# Patient Record
Sex: Female | Born: 1965 | Race: White | Hispanic: No | Smoking: Current some day smoker
Health system: Southern US, Community
[De-identification: ages and names within clinical notes are randomized; demographics above are authoritative.]

## PROBLEM LIST (undated history)

## (undated) DIAGNOSIS — I10 Essential (primary) hypertension: Secondary | ICD-10-CM

## (undated) DIAGNOSIS — E7800 Pure hypercholesterolemia, unspecified: Secondary | ICD-10-CM

## (undated) DIAGNOSIS — E079 Disorder of thyroid, unspecified: Secondary | ICD-10-CM

## (undated) DIAGNOSIS — G8929 Other chronic pain: Secondary | ICD-10-CM

## (undated) DIAGNOSIS — F32A Depression, unspecified: Secondary | ICD-10-CM

## (undated) DIAGNOSIS — J45909 Unspecified asthma, uncomplicated: Secondary | ICD-10-CM

## (undated) DIAGNOSIS — G4730 Sleep apnea, unspecified: Secondary | ICD-10-CM

## (undated) DIAGNOSIS — M797 Fibromyalgia: Secondary | ICD-10-CM

## (undated) HISTORY — PX: SPINAL FUSION: SHX223

## (undated) HISTORY — PX: KNEE SURGERY: SHX244

## (undated) HISTORY — PX: SHOULDER SURGERY: SHX246

## (undated) HISTORY — PX: CHOLECYSTECTOMY: SHX55

## (undated) HISTORY — PX: OTHER SURGICAL HISTORY: SHX169

## (undated) HISTORY — PX: ABDOMINAL HYSTERECTOMY: SHX81

---

## 2019-12-11 ENCOUNTER — Institutional Professional Consult (permissible substitution): Payer: Medicare Other | Admitting: Neurology

## 2020-01-12 ENCOUNTER — Emergency Department (HOSPITAL_COMMUNITY): Payer: Medicare Other

## 2020-01-12 ENCOUNTER — Emergency Department (HOSPITAL_COMMUNITY)
Admission: EM | Admit: 2020-01-12 | Discharge: 2020-01-12 | Disposition: A | Discharge status: Home / Self Care | Payer: Medicare Other | Attending: Emergency Medicine | Admitting: Emergency Medicine

## 2020-01-12 ENCOUNTER — Encounter (HOSPITAL_COMMUNITY): Payer: Self-pay

## 2020-01-12 ENCOUNTER — Other Ambulatory Visit: Payer: Self-pay

## 2020-01-12 DIAGNOSIS — R0602 Shortness of breath: Secondary | ICD-10-CM | POA: Insufficient documentation

## 2020-01-12 DIAGNOSIS — J45909 Unspecified asthma, uncomplicated: Secondary | ICD-10-CM | POA: Insufficient documentation

## 2020-01-12 DIAGNOSIS — Z20822 Contact with and (suspected) exposure to covid-19: Secondary | ICD-10-CM | POA: Insufficient documentation

## 2020-01-12 DIAGNOSIS — I10 Essential (primary) hypertension: Secondary | ICD-10-CM | POA: Insufficient documentation

## 2020-01-12 DIAGNOSIS — R079 Chest pain, unspecified: Secondary | ICD-10-CM | POA: Diagnosis not present

## 2020-01-12 DIAGNOSIS — F17210 Nicotine dependence, cigarettes, uncomplicated: Secondary | ICD-10-CM | POA: Insufficient documentation

## 2020-01-12 HISTORY — DX: Disorder of thyroid, unspecified: E07.9

## 2020-01-12 HISTORY — DX: Unspecified asthma, uncomplicated: J45.909

## 2020-01-12 HISTORY — DX: Essential (primary) hypertension: I10

## 2020-01-12 HISTORY — DX: Sleep apnea, unspecified: G47.30

## 2020-01-12 HISTORY — DX: Pure hypercholesterolemia, unspecified: E78.00

## 2020-01-12 LAB — BASIC METABOLIC PANEL
Anion gap: 9 (ref 5–15)
BUN: 7 mg/dL (ref 6–20)
CO2: 26 mmol/L (ref 22–32)
Calcium: 9.1 mg/dL (ref 8.9–10.3)
Chloride: 101 mmol/L (ref 98–111)
Creatinine, Ser: 0.83 mg/dL (ref 0.44–1.00)
GFR, Estimated: >60 mL/min (ref >60)
Glucose, Bld: 110 mg/dL — ABNORMAL HIGH (ref 70–99)
Potassium: 3.9 mmol/L (ref 3.5–5.1)
Sodium: 136 mmol/L (ref 135–145)

## 2020-01-12 LAB — CBC
HCT: 39.8 % (ref 36.0–46.0)
Hemoglobin: 13 g/dL (ref 12.0–15.0)
MCH: 29.2 pg (ref 26.0–34.0)
MCHC: 32.7 g/dL (ref 30.0–36.0)
MCV: 89.4 fL (ref 80.0–100.0)
Platelets: 263 10*3/uL (ref 150–400)
RBC: 4.45 MIL/uL (ref 3.87–5.11)
RDW: 11.8 % (ref 11.5–15.5)
WBC: 7.7 10*3/uL (ref 4.0–10.5)
nRBC: 0 % (ref 0.0–0.2)

## 2020-01-12 LAB — BRAIN NATRIURETIC PEPTIDE: B Natriuretic Peptide: 49 pg/mL (ref 0.0–100.0)

## 2020-01-12 LAB — TROPONIN I (HIGH SENSITIVITY): Troponin I (High Sensitivity): 3 ng/L (ref <18)

## 2020-01-12 LAB — RESPIRATORY PANEL BY RT PCR (FLU A&B, COVID)
Influenza A by PCR: NEGATIVE
Influenza B by PCR: NEGATIVE
SARS Coronavirus 2 by RT PCR: NEGATIVE

## 2020-01-12 MED ORDER — SODIUM CHLORIDE 0.9 % IV BOLUS
1000.0000 mL | Freq: Once | INTRAVENOUS | Status: AC
  Administered 2020-01-12: 1000 mL via INTRAVENOUS

## 2020-01-12 MED ORDER — IOHEXOL 350 MG/ML SOLN
100.0000 mL | Freq: Once | INTRAVENOUS | Status: AC | PRN
  Administered 2020-01-12: 100 mL via INTRAVENOUS

## 2020-01-12 NOTE — ED Triage Notes (Signed)
Pt presents to ED with complaints of chest pressure x couple of weeks, left arm pain and tingling.

## 2020-01-12 NOTE — Discharge Instructions (Addendum)
You were evaluated in the Emergency Department and after careful evaluation, we did not find any emergent condition requiring admission or further testing in the hospital.  Your exam/testing today was overall reassuring.  We suspect that your symptoms may be related to underlying depression.  We still encourage you to keep your follow-up with your PCP and/or cardiologist but we also recommend follow-up with a therapist or psychiatrist.  Please return to the Emergency Department if you experience any worsening of your condition.  Thank you for allowing [place] to be a part of your care.

## 2020-01-12 NOTE — ED Provider Notes (Signed)
[facility]-EMERGENCY DEPT [facility] Emergency Department Provider Note MRN:  [id number]  Arrival date & time: 01/12/20     Chief Complaint   Chest Pain   History of Present Illness   Deborah Delgado is a 54 y.o. year-old female with a history of hypertension presenting to the ED with chief complaint of chest pain.  On and off chest pain or shortness of breath for the past 1 or 2 weeks, worse with deep breaths, associated with lightheadedness when standing.  Also endorsing fatigue, malaise, poor appetite.  Does note continued issues with depression but denies SI or HI, no AVH.  No drugs or alcohol, continues to smoke.  Denies fever or cough, no other complaints.  Review of Systems  A complete 10 system review of systems was obtained and all systems are negative except as noted in the HPI and PMH.   Patient's Health History    Past Medical History:  Diagnosis Date  . Asthma   . Hypercholesteremia   . Hypertension   . Sleep apnea   . Thyroid disease     Past Surgical History:  Procedure Laterality Date  . ABDOMINAL HYSTERECTOMY    . carpel tunnel    . CHOLECYSTECTOMY    . KNEE SURGERY    . SHOULDER SURGERY      No family history on file.  Social History   Socioeconomic History  . Marital status: Divorced    Spouse name: Not on file  . Number of children: Not on file  . Years of education: Not on file  . Highest education level: Not on file  Occupational History  . Not on file  Tobacco Use  . Smoking status: Current Every Day Smoker    Packs/day: 0.50    Types: Cigarettes  . Smokeless tobacco: Never Used  Substance and Sexual Activity  . Alcohol use: Yes    Comment: occ  . Drug use: Not Currently  . Sexual activity: Not on file  Other Topics Concern  . Not on file  Social History Narrative  . Not on file   Social Determinants of Health   Financial Resource Strain:   . Difficulty of Paying Living Expenses: Not on file  Food Insecurity:   . Worried About  Programme researcher, broadcasting/film/video in the Last Year: Not on file  . Ran Out of Food in the Last Year: Not on file  Transportation Needs:   . Lack of Transportation (Medical): Not on file  . Lack of Transportation (Non-Medical): Not on file  Physical Activity:   . Days of Exercise per Week: Not on file  . Minutes of Exercise per Session: Not on file  Stress:   . Feeling of Stress : Not on file  Social Connections:   . Frequency of Communication with Friends and Family: Not on file  . Frequency of Social Gatherings with Friends and Family: Not on file  . Attends Religious Services: Not on file  . Active Member of Clubs or Organizations: Not on file  . Attends Banker Meetings: Not on file  . Marital Status: Not on file  Intimate Partner Violence:   . Fear of Current or Ex-Partner: Not on file  . Emotionally Abused: Not on file  . Physically Abused: Not on file  . Sexually Abused: Not on file     Physical Exam   Vitals:   01/12/20 1130 01/12/20 1300  BP: (!) 135/105 112/73  Pulse: 75 66  Resp: (!) 21 15  Temp:    SpO2: 97% 95%    CONSTITUTIONAL: Well-appearing, NAD NEURO:  Alert and oriented x 3, no focal deficits EYES:  eyes equal and reactive ENT/NECK:  no LAD, no JVD CARDIO: Regular rate, well-perfused, normal S1 and S2 PULM:  CTAB no wheezing or rhonchi GI/GU:  normal bowel sounds, non-distended, non-tender MSK/SPINE:  No gross deformities, no edema SKIN:  no rash, atraumatic PSYCH:  Appropriate speech and behavior  *Additional and/or pertinent findings included in MDM below  Diagnostic and Interventional Summary    EKG Interpretation  Date/Time:  Monday January 12 2020 10:00:48 EST Ventricular Rate:  64 PR Interval:    QRS Duration: 84 QT Interval:  404 QTC Calculation: 417 R Axis:   17 Text Interpretation: Sinus rhythm Abnormal R-wave progression, early transition Baseline wander in lead(s) III No previous ECGs available Confirmed by Kennis Carina [id number])  on 01/12/2020 1:16:04 PM      Labs Reviewed  BASIC METABOLIC PANEL - Abnormal; Notable for the following components:      Result Value   Glucose, Bld 110 (*)    All other components within normal limits  RESPIRATORY PANEL BY RT PCR (FLU A&B, COVID)  CBC  BRAIN NATRIURETIC PEPTIDE  TROPONIN I (HIGH SENSITIVITY)    CT ANGIO CHEST PE W OR WO CONTRAST  Final Result    DG Chest Port 1 View  Final Result      Medications  sodium chloride 0.9 % bolus 1,000 mL (1,000 mLs Intravenous New Bag/Given 01/12/20 1101)  iohexol (OMNIPAQUE) 350 MG/ML injection 100 mL (100 mLs Intravenous Contrast Given 01/12/20 1152)     Procedures  /  Critical Care Procedures  ED Course and Medical Decision Making  I have reviewed the triage vital signs, the nursing notes, and pertinent available records from the EMR.  Listed above are laboratory and imaging tests that I personally ordered, reviewed, and interpreted and then considered in my medical decision making (see below for details).  Considering ACS and or PE, awaiting troponin, CTA.  Considering underlying depression contributing to patient's symptoms.     Work-up is reassuring, troponin negative, no need for second troponin given symptoms present for the past 2 weeks.  PE study is negative as well.  Patient continues to look and feel well with normal vital signs, has cardiology follow-up tomorrow.  Also advised psychiatry follow-up given her longstanding depression.  Elmer Sow. Pilar Plate, MD [facility] Emergency Medicine [facility] [email or web address]  Final Clinical Impressions(s) / ED Diagnoses     ICD-10-CM   1. Chest pain, unspecified type  R07.9   2. Chest pain  R07.9 DG Chest Port 1 View    DG Chest Port 1 View    ED Discharge Orders    None       Discharge Instructions Discussed with and Provided to Patient:     Discharge Instructions     You were evaluated in the Emergency Department and after careful  evaluation, we did not find any emergent condition requiring admission or further testing in the hospital.  Your exam/testing today was overall reassuring.  We suspect that your symptoms may be related to underlying depression.  We still encourage you to keep your follow-up with your PCP and/or cardiologist but we also recommend follow-up with a therapist or psychiatrist.  Please return to the Emergency Department if you experience any worsening of your condition.  Thank you for allowing [place] to be a part of your care.  Sabas Sous, MD 01/12/20 1335

## 2020-07-29 ENCOUNTER — Emergency Department (HOSPITAL_COMMUNITY): Payer: Medicare Other

## 2020-07-29 ENCOUNTER — Other Ambulatory Visit: Payer: Self-pay

## 2020-07-29 ENCOUNTER — Emergency Department (HOSPITAL_COMMUNITY)
Admission: EM | Admit: 2020-07-29 | Discharge: 2020-07-29 | Disposition: A | Discharge status: Home / Self Care | Payer: Medicare Other | Attending: Emergency Medicine | Admitting: Emergency Medicine

## 2020-07-29 ENCOUNTER — Encounter (HOSPITAL_COMMUNITY): Payer: Self-pay | Admitting: Emergency Medicine

## 2020-07-29 DIAGNOSIS — R079 Chest pain, unspecified: Secondary | ICD-10-CM

## 2020-07-29 DIAGNOSIS — F17210 Nicotine dependence, cigarettes, uncomplicated: Secondary | ICD-10-CM | POA: Diagnosis not present

## 2020-07-29 DIAGNOSIS — R1013 Epigastric pain: Secondary | ICD-10-CM | POA: Insufficient documentation

## 2020-07-29 DIAGNOSIS — Z20822 Contact with and (suspected) exposure to covid-19: Secondary | ICD-10-CM | POA: Diagnosis not present

## 2020-07-29 DIAGNOSIS — R197 Diarrhea, unspecified: Secondary | ICD-10-CM | POA: Diagnosis not present

## 2020-07-29 DIAGNOSIS — R0789 Other chest pain: Secondary | ICD-10-CM | POA: Diagnosis not present

## 2020-07-29 DIAGNOSIS — J45909 Unspecified asthma, uncomplicated: Secondary | ICD-10-CM | POA: Diagnosis not present

## 2020-07-29 DIAGNOSIS — I10 Essential (primary) hypertension: Secondary | ICD-10-CM | POA: Insufficient documentation

## 2020-07-29 DIAGNOSIS — R350 Frequency of micturition: Secondary | ICD-10-CM | POA: Insufficient documentation

## 2020-07-29 DIAGNOSIS — R42 Dizziness and giddiness: Secondary | ICD-10-CM | POA: Diagnosis not present

## 2020-07-29 DIAGNOSIS — R109 Unspecified abdominal pain: Secondary | ICD-10-CM | POA: Diagnosis present

## 2020-07-29 HISTORY — DX: Fibromyalgia: M79.7

## 2020-07-29 HISTORY — DX: Other chronic pain: G89.29

## 2020-07-29 HISTORY — DX: Depression, unspecified: F32.A

## 2020-07-29 LAB — COMPREHENSIVE METABOLIC PANEL
ALT: 18 U/L (ref 0–44)
AST: 21 U/L (ref 15–41)
Albumin: 4.3 g/dL (ref 3.5–5.0)
Alkaline Phosphatase: 68 U/L (ref 38–126)
Anion gap: 7 (ref 5–15)
BUN: 5 mg/dL — ABNORMAL LOW (ref 6–20)
CO2: 28 mmol/L (ref 22–32)
Calcium: 9.2 mg/dL (ref 8.9–10.3)
Chloride: 104 mmol/L (ref 98–111)
Creatinine, Ser: 0.77 mg/dL (ref 0.44–1.00)
GFR, Estimated: >60 mL/min (ref >60)
Glucose, Bld: 85 mg/dL (ref 70–99)
Potassium: 3.4 mmol/L — ABNORMAL LOW (ref 3.5–5.1)
Sodium: 139 mmol/L (ref 135–145)
Total Bilirubin: 0.5 mg/dL (ref 0.3–1.2)
Total Protein: 7.4 g/dL (ref 6.5–8.1)

## 2020-07-29 LAB — CBC WITH DIFFERENTIAL/PLATELET
Abs Immature Granulocytes: 0.02 10*3/uL (ref 0.00–0.07)
Basophils Absolute: 0 10*3/uL (ref 0.0–0.1)
Basophils Relative: 1 %
Eosinophils Absolute: 0.1 10*3/uL (ref 0.0–0.5)
Eosinophils Relative: 2 %
HCT: 40.8 % (ref 36.0–46.0)
Hemoglobin: 13.2 g/dL (ref 12.0–15.0)
Immature Granulocytes: 0 %
Lymphocytes Relative: 37 %
Lymphs Abs: 2.7 10*3/uL (ref 0.7–4.0)
MCH: 29.8 pg (ref 26.0–34.0)
MCHC: 32.4 g/dL (ref 30.0–36.0)
MCV: 92.1 fL (ref 80.0–100.0)
Monocytes Absolute: 0.4 10*3/uL (ref 0.1–1.0)
Monocytes Relative: 6 %
Neutro Abs: 4.1 10*3/uL (ref 1.7–7.7)
Neutrophils Relative %: 54 %
Platelets: 348 10*3/uL (ref 150–400)
RBC: 4.43 MIL/uL (ref 3.87–5.11)
RDW: 12 % (ref 11.5–15.5)
WBC: 7.3 10*3/uL (ref 4.0–10.5)
nRBC: 0 % (ref 0.0–0.2)

## 2020-07-29 LAB — RESP PANEL BY RT-PCR (FLU A&B, COVID) ARPGX2
Influenza A by PCR: NEGATIVE
Influenza B by PCR: NEGATIVE
SARS Coronavirus 2 by RT PCR: NEGATIVE

## 2020-07-29 LAB — URINALYSIS, ROUTINE W REFLEX MICROSCOPIC
Bacteria, UA: NONE SEEN
Bilirubin Urine: NEGATIVE
Glucose, UA: NEGATIVE mg/dL
Ketones, ur: NEGATIVE mg/dL
Leukocytes,Ua: NEGATIVE
Nitrite: NEGATIVE
Protein, ur: NEGATIVE mg/dL
Specific Gravity, Urine: 1.001 — ABNORMAL LOW (ref 1.005–1.030)
pH: 7 (ref 5.0–8.0)

## 2020-07-29 LAB — LIPASE, BLOOD: Lipase: 26 U/L (ref 11–51)

## 2020-07-29 LAB — TROPONIN I (HIGH SENSITIVITY): Troponin I (High Sensitivity): 3 ng/L (ref <18)

## 2020-07-29 MED ORDER — SUCRALFATE 1 GM/10ML PO SUSP: 1.0000 g | Freq: Three times a day (TID) | ORAL | 0 refills | Status: DC

## 2020-07-29 MED ORDER — LIDOCAINE VISCOUS HCL 2 % MT SOLN
15.0000 mL | Freq: Once | OROMUCOSAL | Status: AC
  Administered 2020-07-29: 15 mL via ORAL
  Filled 2020-07-29: qty 15

## 2020-07-29 MED ORDER — SUCRALFATE 1 GM/10ML PO SUSP: 1.0000 g | Freq: Three times a day (TID) | ORAL | 0 refills | Status: DC | PRN

## 2020-07-29 MED ORDER — ALUM & MAG HYDROXIDE-SIMETH 200-200-20 MG/5ML PO SUSP
30.0000 mL | Freq: Once | ORAL | Status: AC
  Administered 2020-07-29: 30 mL via ORAL
  Filled 2020-07-29: qty 30

## 2020-07-29 NOTE — Discharge Instructions (Addendum)
You were seen in the emergency department today for your upper abdominal and lower chest pain.  Your EKG, chest x-ray, and blood work were very reassuring, this does not appear to be related to your heart.  Given your symptoms improved after you drank the GI cocktail, suspect your acid reflux is contributing to your symptoms, especially given the recent stress you have been experiencing. Please continue taking the Protonix 40 mg daily, and utilize a new prescription for Carafate as needed for flares. Please follow-up with your primary care doctor as discussed for adjustment of your depression medication, and continue to follow with gastroenterology providers as scheduled.  Return to the emergency department if you develop any worsening chest pain, shortness of breath, nausea or vomiting that does not stop, or any other new severe symptoms.

## 2020-07-29 NOTE — ED Triage Notes (Addendum)
Pt c/o abd pain with nausea and cough with chest congestion/tightness

## 2020-07-29 NOTE — ED Provider Notes (Signed)
[facility] EMERGENCY DEPARTMENT Provider Note   CSN: [id number] Arrival date & time: 07/29/20  1626     History Chief Complaint  Patient presents with  . Abdominal Pain    Deborah Delgado is a 55 y.o. female who presents with concern for episode of sudden sharp central chest pain that started yesterday and has had residual central chest pressure since that time.  She does have history of reflux, on Protonix daily.  Additionally she endorses 1 week of intermittent abdominal cramping, worse in the upper abdomen with a few episodes of diarrhea today, denies melena or hematochezia.  Does endorse urinary frequency but denies urgency or dysuria.  Additionally she endorses episodes of lightheadedness that are worse after change in position.  Patient has been vaccinating is COVID-19 and lives alone, states that she  has not been around anyone sick to her knowledge.  I personally reviewed this patient's medical records.  She has history of hypertension, hypercholesterolemia, thyroid disease, and asthma.  [place] care is in [place].  As of December 2022, patient's active medications included Xanax, albuterol, Seroquel, Synthroid, Crestor, amlodipine, Breo Ellipta, metoprolol, Lyrica.  HPI     Past Medical History:  Diagnosis Date  . Asthma   . Chronic pain   . Depression   . Fibromyalgia   . Hypercholesteremia   . Hypertension   . Sleep apnea   . Thyroid disease     There are no problems to display for this patient.   Past Surgical History:  Procedure Laterality Date  . ABDOMINAL HYSTERECTOMY    . carpel tunnel    . CHOLECYSTECTOMY    . KNEE SURGERY    . SHOULDER SURGERY    . SPINAL FUSION       OB History   No obstetric history on file.     History reviewed. No pertinent family history.  Social History   Tobacco Use  . Smoking status: Current Every Day Smoker    Packs/day: 0.50    Types: Cigarettes  . Smokeless tobacco: Never Used  Vaping Use   . Vaping Use: Never used  Substance Use Topics  . Alcohol use: Yes    Comment: occ  . Drug use: Not Currently    Home Medications Prior to Admission medications   Medication Sig Start Date End Date Taking? Authorizing Provider  sucralfate (CARAFATE) 1 GM/10ML suspension Take 10 mLs (1 g total) by mouth 3 (three) times daily as needed. 07/29/20   Aybree Lanyon, Eugene Gaviaebekah R, PA-C    Allergies    Patient has no known allergies.  Review of Systems   Review of Systems  Constitutional: Positive for fatigue. Negative for activity change, appetite change, chills, diaphoresis and fever.  HENT: Positive for congestion. Negative for sinus pressure, sinus pain, sneezing, sore throat, tinnitus, trouble swallowing and voice change.   Respiratory: Positive for chest tightness. Negative for cough and shortness of breath.   Cardiovascular: Positive for chest pain. Negative for palpitations and leg swelling.  Gastrointestinal: Positive for abdominal pain and diarrhea. Negative for abdominal distention, anal bleeding, blood in stool, constipation, nausea and vomiting.  Genitourinary: Positive for frequency. Negative for decreased urine volume, difficulty urinating, dyspareunia, dysuria, enuresis, flank pain, genital sores, hematuria, menstrual problem, pelvic pain, urgency, vaginal bleeding, vaginal discharge and vaginal pain.  Musculoskeletal: Negative.   Skin: Negative.   Neurological: Positive for light-headedness. Negative for dizziness, tremors, seizures, syncope, speech difficulty, weakness, numbness and headaches.  Psychiatric/Behavioral: Positive for confusion.    Physical  Exam Updated Vital Signs BP 139/76   Pulse 64   Temp 98.8 F (37.1 C) (Oral)   Resp 17   Ht 5\' 3"  (1.6 m)   Wt 74.8 kg   SpO2 98%   BMI 29.23 kg/m   Physical Exam Vitals and nursing note reviewed.  Constitutional:      Appearance: She is overweight. She is not ill-appearing or toxic-appearing.  HENT:     Head:  Normocephalic and atraumatic.     Nose: Nose normal.     Mouth/Throat:     Mouth: Mucous membranes are moist.     Tongue: No lesions.     Palate: No lesions.     Pharynx: Oropharynx is clear. Uvula midline. No oropharyngeal exudate, posterior oropharyngeal erythema or uvula swelling.     Tonsils: No tonsillar exudate.  Eyes:     General: Lids are normal. Vision grossly intact.        Right eye: No discharge.        Left eye: No discharge.     Extraocular Movements: Extraocular movements intact.     Conjunctiva/sclera: Conjunctivae normal.     Pupils: Pupils are equal, round, and reactive to light.  Cardiovascular:     Rate and Rhythm: Normal rate and regular rhythm.     Pulses: Normal pulses.     Heart sounds: Normal heart sounds. No murmur heard.   Pulmonary:     Effort: Pulmonary effort is normal. No tachypnea, bradypnea, accessory muscle usage, prolonged expiration or respiratory distress.     Breath sounds: Normal breath sounds. No wheezing or rales.  Chest:     Chest wall: No mass, lacerations, deformity, swelling, tenderness, crepitus or edema.  Abdominal:     General: Bowel sounds are normal. There is no distension.     Palpations: Abdomen is soft.     Tenderness: There is abdominal tenderness in the epigastric area and suprapubic area. There is no right CVA tenderness, left CVA tenderness, guarding or rebound. Negative signs include Murphy's sign, Rovsing's sign and McBurney's sign.  Musculoskeletal:        General: No deformity.     Cervical back: Neck supple.     Right lower leg: No edema.     Left lower leg: No edema.  Skin:    General: Skin is warm and dry.  Neurological:     General: No focal deficit present.     Mental Status: She is alert. Mental status is at baseline.     Gait: Gait is intact.  Psychiatric:        Mood and Affect: Mood normal.    ED Results / Procedures / Treatments   Labs (all labs ordered are listed, but only abnormal results are  displayed) Labs Reviewed  COMPREHENSIVE METABOLIC PANEL - Abnormal; Notable for the following components:      Result Value   Potassium 3.4 (*)    BUN 5 (*)    All other components within normal limits  URINALYSIS, ROUTINE W REFLEX MICROSCOPIC - Abnormal; Notable for the following components:   Color, Urine COLORLESS (*)    Specific Gravity, Urine 1.001 (*)    Hgb urine dipstick SMALL (*)    All other components within normal limits  RESP PANEL BY RT-PCR (FLU A&B, COVID) [id number]  URINE CULTURE  CBC WITH DIFFERENTIAL/PLATELET  LIPASE, BLOOD  TROPONIN I (HIGH SENSITIVITY)    EKG EKG Interpretation  Date/Time:  Thursday Jul 29 2020 16:50:06 EDT Ventricular Rate:  65  PR Interval:  158 QRS Duration: 88 QT Interval:  400 QTC Calculation: 416 R Axis:   24 Text Interpretation: Normal sinus rhythm Normal ECG Confirmed by Eber Hong ([id number]) on 07/29/2020 4:54:35 PM   Radiology DG Chest Port 1 View  Result Date: 07/29/2020 CLINICAL DATA:  55 year old female with chest pain. EXAM: PORTABLE CHEST 1 VIEW COMPARISON:  Chest radiograph dated 01/12/2020 and CT dated 01/12/2020 FINDINGS: No focal consolidation, pleural effusion, or pneumothorax. The cardiac silhouette is within limits. No acute osseous pathology. IMPRESSION: No active disease. Electronically Signed   By: Elgie Collard M.D.   On: 07/29/2020 17:44    Procedures Procedures   Medications Ordered in ED Medications  alum & mag hydroxide-simeth (MAALOX/MYLANTA) 200-200-20 MG/5ML suspension 30 mL (30 mLs Oral Given 07/29/20 1845)    And  lidocaine (XYLOCAINE) 2 % viscous mouth solution 15 mL (15 mLs Oral Given 07/29/20 1845)    ED Course  I have reviewed the triage vital signs and the nursing notes.  Pertinent labs & imaging results that were available during my care of the patient were reviewed by me and considered in my medical decision making (see chart for details).    MDM Rules/Calculators/A&P                           55 year old female presents with concern for episode of sharp central chest pain with residual central chest pressure that started yesterday afternoon.  Differential diagnosis includes but is not limited to ACS, PE, pleural effusion, pneumonia, upper respiratory infection, GERD, musculoskeletal injury.  Differential diagnosis for patient's abdominal pain and diarrhea include but are not limited to acute infectious gastroenteritis, GERD, gastritis, colitis, constipation, IBS, appendicitis, diverticulitis, UTI/pyelonephritis.  Hypertensive on intake, vital signs otherwise normal.  Cardiopulmonary exam is normal, abdominal exam with mild suprapubic tenderness to palpation and epigastric tenderness to palpation.  Patient is neurovascularly intact in all 4 extremities.  HEENT exam is unremarkable.  CBC is unremarkable, CMP with mild hypokalemia of 3.4; UA Not concerning for infection, respiratory pathogen panel negative for COVID and flu.  Troponin is negative, 3.  Chest x-ray is negative for acute cardiopulmonary disease.  EKG with normal sinus rhythm without ischemic changes.  Patient ministered GI cocktail with improvement in her symptoms of epigastric pain and central chest pressure.  Patient is already on Protonix 40 mg.  No further work-up warranted in ED at this time given reassuring work-up, suspect patient symptoms are contributed to by GERD secondary to acute increase in stress.  Patient lost her son in April 2010, and that with the recent holiday of Mother's Day have caused her to have increased stress and worsening depression this month.  Recommend she continue taking Protonix, and will offer Carafate prescription.  Recommend she follow-up closely with her PCP for management of her depression medications as she feels that is poorly managed at this time.  Patient denies SI/HI/AVH.  Deborah Delgado voiced understanding of her medical evaluation and treatment plan.  Each of her questions was answered to her  expressed satisfaction.  Return precautions were given.  Patient is well-appearing, stable, and appropriate for discharge at this time.  This chart was dictated using voice recognition software, Dragon. Despite the best efforts of this provider to proofread and correct errors, errors may still occur which can change documentation meaning.  Final Clinical Impression(s) / ED Diagnoses Final diagnoses:  Epigastric pain    Rx / DC Orders ED Discharge Orders  Ordered    sucralfate (CARAFATE) 1 GM/10ML suspension  3 times daily with meals & bedtime,   Status:  Discontinued        07/29/20 1923    sucralfate (CARAFATE) 1 GM/10ML suspension  3 times daily PRN,   Status:  Discontinued        07/29/20 1923    sucralfate (CARAFATE) 1 GM/10ML suspension  3 times daily PRN        07/29/20 1924           Shaquanta Harkless, Idelia Salm 07/29/20 1934    Eber Hong, MD 07/30/20 [phone]

## 2020-07-31 LAB — URINE CULTURE: Culture: NO GROWTH

## 2021-08-07 ENCOUNTER — Emergency Department (HOSPITAL_COMMUNITY): Payer: Medicare HMO

## 2021-08-07 ENCOUNTER — Encounter (HOSPITAL_COMMUNITY): Payer: Self-pay

## 2021-08-07 ENCOUNTER — Other Ambulatory Visit: Payer: Self-pay

## 2021-08-07 ENCOUNTER — Emergency Department (HOSPITAL_COMMUNITY)
Admission: EM | Admit: 2021-08-07 | Discharge: 2021-08-07 | Disposition: A | Discharge status: Home / Self Care | Payer: Medicare HMO | Attending: Emergency Medicine | Admitting: Emergency Medicine

## 2021-08-07 DIAGNOSIS — R109 Unspecified abdominal pain: Secondary | ICD-10-CM | POA: Insufficient documentation

## 2021-08-07 DIAGNOSIS — I10 Essential (primary) hypertension: Secondary | ICD-10-CM | POA: Insufficient documentation

## 2021-08-07 DIAGNOSIS — R350 Frequency of micturition: Secondary | ICD-10-CM | POA: Diagnosis not present

## 2021-08-07 DIAGNOSIS — R300 Dysuria: Secondary | ICD-10-CM | POA: Diagnosis not present

## 2021-08-07 DIAGNOSIS — Z9049 Acquired absence of other specified parts of digestive tract: Secondary | ICD-10-CM | POA: Insufficient documentation

## 2021-08-07 DIAGNOSIS — M797 Fibromyalgia: Secondary | ICD-10-CM | POA: Insufficient documentation

## 2021-08-07 LAB — URINALYSIS, ROUTINE W REFLEX MICROSCOPIC
Bacteria, UA: NONE SEEN
Bilirubin Urine: NEGATIVE
Glucose, UA: NEGATIVE mg/dL
Ketones, ur: NEGATIVE mg/dL
Leukocytes,Ua: NEGATIVE
Nitrite: NEGATIVE
Protein, ur: NEGATIVE mg/dL
Specific Gravity, Urine: 1.001 — ABNORMAL LOW (ref 1.005–1.030)
pH: 6 (ref 5.0–8.0)

## 2021-08-07 LAB — CBC WITH DIFFERENTIAL/PLATELET
Abs Immature Granulocytes: 0.02 10*3/uL (ref 0.00–0.07)
Basophils Absolute: 0 10*3/uL (ref 0.0–0.1)
Basophils Relative: 1 %
Eosinophils Absolute: 0.1 10*3/uL (ref 0.0–0.5)
Eosinophils Relative: 1 %
HCT: 38.3 % (ref 36.0–46.0)
Hemoglobin: 12.7 g/dL (ref 12.0–15.0)
Immature Granulocytes: 0 %
Lymphocytes Relative: 27 %
Lymphs Abs: 2.1 10*3/uL (ref 0.7–4.0)
MCH: 31 pg (ref 26.0–34.0)
MCHC: 33.2 g/dL (ref 30.0–36.0)
MCV: 93.4 fL (ref 80.0–100.0)
Monocytes Absolute: 0.5 10*3/uL (ref 0.1–1.0)
Monocytes Relative: 7 %
Neutro Abs: 5 10*3/uL (ref 1.7–7.7)
Neutrophils Relative %: 64 %
Platelets: 304 10*3/uL (ref 150–400)
RBC: 4.1 MIL/uL (ref 3.87–5.11)
RDW: 12.7 % (ref 11.5–15.5)
WBC: 7.7 10*3/uL (ref 4.0–10.5)
nRBC: 0 % (ref 0.0–0.2)

## 2021-08-07 LAB — COMPREHENSIVE METABOLIC PANEL
ALT: 16 U/L (ref 0–44)
AST: 18 U/L (ref 15–41)
Albumin: 3.9 g/dL (ref 3.5–5.0)
Alkaline Phosphatase: 59 U/L (ref 38–126)
Anion gap: 4 — ABNORMAL LOW (ref 5–15)
BUN: 8 mg/dL (ref 6–20)
CO2: 29 mmol/L (ref 22–32)
Calcium: 8.8 mg/dL — ABNORMAL LOW (ref 8.9–10.3)
Chloride: 106 mmol/L (ref 98–111)
Creatinine, Ser: 0.69 mg/dL (ref 0.44–1.00)
GFR, Estimated: >60 mL/min (ref >60)
Glucose, Bld: 92 mg/dL (ref 70–99)
Potassium: 4.5 mmol/L (ref 3.5–5.1)
Sodium: 139 mmol/L (ref 135–145)
Total Bilirubin: 0.4 mg/dL (ref 0.3–1.2)
Total Protein: 6.6 g/dL (ref 6.5–8.1)

## 2021-08-07 LAB — LIPASE, BLOOD: Lipase: 25 U/L (ref 11–51)

## 2021-08-07 MED ORDER — OXYCODONE-ACETAMINOPHEN 5-325 MG PO TABS
1.0000 | ORAL_TABLET | Freq: Once | ORAL | Status: AC
  Administered 2021-08-07: 1 via ORAL
  Filled 2021-08-07: qty 1

## 2021-08-07 MED ORDER — OXYCODONE-ACETAMINOPHEN 5-325 MG PO TABS
1.0000 | ORAL_TABLET | ORAL | Status: DC | PRN
  Administered 2021-08-07: 1 via ORAL
  Filled 2021-08-07: qty 1

## 2021-08-07 NOTE — ED Provider Notes (Signed)
[facility] EMERGENCY DEPARTMENT Provider Note   CSN: [id number] Arrival date & time: 08/07/21  1418     History  Chief Complaint  Patient presents with   Flank Pain    Deborah Delgado is a 56 y.o. female.  HPI Patient is a 56 year old female with history of hypertension, hyperlipidemia, fibromyalgia, status post cholecystectomy and hysterectomy, who presents to the emergency department due to abdominal pain.  States that her symptoms started about 2 weeks ago.  Symptoms have been waxing and waning but worsening.  Pain is along the right abdomen and right flank.  Reports urinary frequency as well as dysuria.  Pain is worse with movement.  Reports nausea but no vomiting or diarrhea.    Home Medications Prior to Admission medications   Medication Sig Start Date End Date Taking? Authorizing Provider  sucralfate (CARAFATE) 1 GM/10ML suspension Take 10 mLs (1 g total) by mouth 3 (three) times daily as needed. 07/29/20   Sponseller, Eugene Gavia, PA-C      Allergies    Patient has no known allergies.    Review of Systems   Review of Systems  All other systems reviewed and are negative. Ten systems reviewed and are negative for acute change, except as noted in the HPI.  Physical Exam Updated Vital Signs BP 131/73 (BP Location: Right Arm)   Pulse 73   Temp 97.9 F (36.6 C) (Temporal)   Resp 18   Ht 5\' 3"  (1.6 m)   Wt 56.7 kg   SpO2 100%   BMI 22.14 kg/m  Physical Exam Vitals and nursing note reviewed.  Constitutional:      General: She is not in acute distress.    Appearance: Normal appearance. She is normal weight. She is not ill-appearing, toxic-appearing or diaphoretic.  HENT:     Head: Normocephalic and atraumatic.     Right Ear: External ear normal.     Left Ear: External ear normal.     Nose: Nose normal.     Mouth/Throat:     Mouth: Mucous membranes are moist.     Pharynx: Oropharynx is clear. No oropharyngeal exudate or posterior oropharyngeal erythema.  Eyes:      General: No scleral icterus.       Right eye: No discharge.        Left eye: No discharge.     Extraocular Movements: Extraocular movements intact.     Conjunctiva/sclera: Conjunctivae normal.  Cardiovascular:     Rate and Rhythm: Normal rate and regular rhythm.     Pulses: Normal pulses.     Heart sounds: Normal heart sounds. No murmur heard.   No friction rub. No gallop.  Pulmonary:     Effort: Pulmonary effort is normal. No respiratory distress.     Breath sounds: Normal breath sounds. No stridor. No wheezing, rhonchi or rales.  Abdominal:     General: Abdomen is flat.     Palpations: Abdomen is soft.     Tenderness: There is abdominal tenderness. There is right CVA tenderness. There is no left CVA tenderness.     Comments: Abdomen is flat and soft.  Moderate tenderness noted along the right central lateral abdomen as well as the right CVA.  Musculoskeletal:        General: Normal range of motion.     Cervical back: Normal range of motion and neck supple. No tenderness.  Skin:    General: Skin is warm and dry.  Neurological:     General: No focal  deficit present.     Mental Status: She is alert and oriented to person, place, and time.  Psychiatric:        Mood and Affect: Mood normal.        Behavior: Behavior normal.   ED Results / Procedures / Treatments   Labs (all labs ordered are listed, but only abnormal results are displayed) Labs Reviewed  URINALYSIS, ROUTINE W REFLEX MICROSCOPIC - Abnormal; Notable for the following components:      Result Value   Color, Urine COLORLESS (*)    Specific Gravity, Urine 1.001 (*)    Hgb urine dipstick MODERATE (*)    All other components within normal limits  COMPREHENSIVE METABOLIC PANEL - Abnormal; Notable for the following components:   Calcium 8.8 (*)    Anion gap 4 (*)    All other components within normal limits  URINE CULTURE  CBC WITH DIFFERENTIAL/PLATELET  LIPASE, BLOOD   EKG EKG Interpretation  Date/Time:  Sunday  August 07 2021 19:52:54 EDT Ventricular Rate:  63 PR Interval:  138 QRS Duration: 76 QT Interval:  396 QTC Calculation: 405 R Axis:   62 Text Interpretation: Normal sinus rhythm Normal ECG Confirmed by Cathren Laine ([id number]) on 08/07/2021 7:57:53 PM  Radiology CT Renal Stone Study  Result Date: 08/07/2021 CLINICAL DATA:  Right-sided flank pain EXAM: CT ABDOMEN AND PELVIS WITHOUT CONTRAST TECHNIQUE: Multidetector CT imaging of the abdomen and pelvis was performed following the standard protocol without IV contrast. RADIATION DOSE REDUCTION: This exam was performed according to the departmental dose-optimization program which includes automated exposure control, adjustment of the mA and/or kV according to patient size and/or use of iterative reconstruction technique. COMPARISON:  None Available. FINDINGS: Lower chest: Lung bases demonstrate no acute consolidation or effusion. Hepatobiliary: No focal liver abnormality is seen. Status post cholecystectomy. No biliary dilatation. Pancreas: Unremarkable. No pancreatic ductal dilatation or surrounding inflammatory changes. Spleen: Normal in size without focal abnormality. Adrenals/Urinary Tract: Adrenal glands are within normal limits. No hydronephrosis. No ureteral stone. The bladder is unremarkable. A punctate right pelvic calcification is separate from the ureter. Stomach/Bowel: The stomach is nonenlarged. There is no dilated small bowel. No acute bowel wall thickening. Negative appendix. Vascular/Lymphatic: Aortic atherosclerosis. No enlarged abdominal or pelvic lymph nodes. Reproductive: Status post hysterectomy. No adnexal masses. Other: Negative for pelvic effusion or free air. Musculoskeletal: Posterior fusion hardware at L5-S1 with degenerative changes at L5-S1. 11 mm anterolisthesis L5 on S1. Minimal chronic appearing superior endplate deformity at T12. IMPRESSION: 1. Negative for hydronephrosis or ureteral stone. 2. No CT evidence for acute intra-abdominal  or pelvic abnormality. Electronically Signed   By: Jasmine Pang M.D.   On: 08/07/2021 19:09    Procedures Procedures   Medications Ordered in ED Medications  oxyCODONE-acetaminophen (PERCOCET/ROXICET) 5-325 MG per tablet 1 tablet (1 tablet Oral Given 08/07/21 1456)  oxyCODONE-acetaminophen (PERCOCET/ROXICET) 5-325 MG per tablet 1 tablet (1 tablet Oral Given 08/07/21 1853)   ED Course/ Medical Decision Making/ A&P                           Medical Decision Making Amount and/or Complexity of Data Reviewed Labs: ordered. Radiology: ordered.  Risk Prescription drug management.   Pt is a 56 y.o. female who presents to the emergency department with 2 weeks of waxing and waning right flank and abdominal pain.  Reports urinary frequency as well as dysuria.  Pain worse with movement.  Reports nausea but no vomiting  or diarrhea.  Labs: CBC without abnormalities. CMP with a calcium of 8.8 and an anion gap of 4. Lipase of 25. UA with moderate hemoglobin. Urine culture sent.  Imaging: CT renal stone study shows no hydronephrosis or ureteral stone.  No CT evidence for acute intra-abdominal or pelvic abnormality.  I, Placido SouLogan Ronen Bromwell, PA-C, personally reviewed and evaluated these images and lab results as part of my medical decision-making.  Unsure the source of the patient's symptoms.  On my exam her abdomen is soft with moderate tenderness noted along the right lateral abdomen as well as the right flank.  Heart is regular rate and rhythm without murmurs, rubs, or gallops.  Lungs are clear to auscultation bilaterally.  Patient is afebrile, nontachycardic, and nontoxic-appearing.  I obtained basic lab work as well as a CT renal stone study.  Lab work and imaging appears reassuring.  CBC without leukocytosis.  Hemoglobin within normal limits.  Lipase within normal limits at 25.  CMP with a calcium of 8.8 but otherwise no electrolyte derangements.  Normal kidney function.  UA shows moderate  hemoglobin but otherwise does not appear infectious.  Given patient's symptoms culture was sent.  Her pain was treated with Percocet and she notes mild to moderate improvement.  She has a ride home.  Symptoms possibly musculoskeletal in nature.  Recommended continued use of Tylenol as well as Motrin.  Heating pads.  PCP follow-up.  She appears stable for discharge at this time and she is agreeable.  We discussed return precautions.  Her questions were answered and she was amicable at the time of discharge.  Note: Portions of this report may have been transcribed using voice recognition software. Every effort was made to ensure accuracy; however, inadvertent computerized transcription errors may be present.   Final Clinical Impression(s) / ED Diagnoses Final diagnoses:  Flank pain  Abdominal pain, unspecified abdominal location   Rx / DC Orders ED Discharge Orders     None         Placido SouJoldersma, Nehemiah Mcfarren, PA-C 08/07/21 2047    Cathren LaineSteinl, Kevin, MD 08/23/21 1105

## 2021-08-07 NOTE — ED Notes (Signed)
Patient transported to CT 

## 2021-08-07 NOTE — Discharge Instructions (Signed)
Please continue to monitor your symptoms closely and return to the emergency department if you develop any new or worsening symptoms.  Please follow-up with your primary care provider regarding your symptoms as well as this visit today.

## 2021-08-07 NOTE — ED Triage Notes (Signed)
Pt c/o R flank pain x 2 weeks. Pt reports urinary frequency with only small amounts each time. Pain is worse with movement.

## 2021-08-09 LAB — URINE CULTURE: Culture: NO GROWTH

## 2021-11-17 ENCOUNTER — Other Ambulatory Visit: Payer: Self-pay

## 2021-11-17 ENCOUNTER — Emergency Department (HOSPITAL_COMMUNITY): Payer: Medicare HMO

## 2021-11-17 ENCOUNTER — Encounter (HOSPITAL_COMMUNITY): Payer: Self-pay | Admitting: Emergency Medicine

## 2021-11-17 ENCOUNTER — Emergency Department (HOSPITAL_COMMUNITY)
Admission: EM | Admit: 2021-11-17 | Discharge: 2021-11-17 | Disposition: A | Discharge status: Home / Self Care | Payer: Medicare HMO | Attending: Emergency Medicine | Admitting: Emergency Medicine

## 2021-11-17 DIAGNOSIS — S0990XA Unspecified injury of head, initial encounter: Secondary | ICD-10-CM | POA: Insufficient documentation

## 2021-11-17 DIAGNOSIS — W19XXXA Unspecified fall, initial encounter: Secondary | ICD-10-CM | POA: Diagnosis not present

## 2021-11-17 DIAGNOSIS — Z79899 Other long term (current) drug therapy: Secondary | ICD-10-CM | POA: Diagnosis not present

## 2021-11-17 DIAGNOSIS — R519 Headache, unspecified: Secondary | ICD-10-CM

## 2021-11-17 LAB — URINALYSIS, ROUTINE W REFLEX MICROSCOPIC
Bacteria, UA: NONE SEEN
Bilirubin Urine: NEGATIVE
Glucose, UA: NEGATIVE mg/dL
Ketones, ur: NEGATIVE mg/dL
Leukocytes,Ua: NEGATIVE
Nitrite: NEGATIVE
Protein, ur: NEGATIVE mg/dL
Specific Gravity, Urine: 1.001 — ABNORMAL LOW (ref 1.005–1.030)
pH: 6 (ref 5.0–8.0)

## 2021-11-17 LAB — BASIC METABOLIC PANEL
Anion gap: 9 (ref 5–15)
BUN: 6 mg/dL (ref 6–20)
CO2: 27 mmol/L (ref 22–32)
Calcium: 9.4 mg/dL (ref 8.9–10.3)
Chloride: 102 mmol/L (ref 98–111)
Creatinine, Ser: 0.76 mg/dL (ref 0.44–1.00)
GFR, Estimated: >60 mL/min (ref >60)
Glucose, Bld: 95 mg/dL (ref 70–99)
Potassium: 3.8 mmol/L (ref 3.5–5.1)
Sodium: 138 mmol/L (ref 135–145)

## 2021-11-17 LAB — CBC
HCT: 40.3 % (ref 36.0–46.0)
Hemoglobin: 13.4 g/dL (ref 12.0–15.0)
MCH: 30 pg (ref 26.0–34.0)
MCHC: 33.3 g/dL (ref 30.0–36.0)
MCV: 90.2 fL (ref 80.0–100.0)
Platelets: 342 10*3/uL (ref 150–400)
RBC: 4.47 MIL/uL (ref 3.87–5.11)
RDW: 11.9 % (ref 11.5–15.5)
WBC: 6.9 10*3/uL (ref 4.0–10.5)
nRBC: 0 % (ref 0.0–0.2)

## 2021-11-17 LAB — POC URINE PREG, ED: Preg Test, Ur: NEGATIVE

## 2021-11-17 NOTE — ED Notes (Signed)
Pt ambulated to the bathroom unassisted.  

## 2021-11-17 NOTE — ED Provider Notes (Signed)
[facility] EMERGENCY DEPARTMENT Provider Note   CSN: [id number] Arrival date & time: 11/17/21  1820     History {Add pertinent medical, surgical, social history, OB history to HPI:1} Chief Complaint  Patient presents with   Headache    Deborah Delgado is a 56 y.o. female.  Fall Monday night not on back of head Tripped going to car  Unsure if LOC No preceding symptoms Denies N/V Has surgery tomorrow so wanted to be checked out today No numbness or weakness of your arms or legs Blurry vision No pain elsewhere Has chronic neck pain but no sig change   Headache      Home Medications Prior to Admission medications   Medication Sig Start Date End Date Taking? Authorizing Provider  albuterol (PROVENTIL) (2.5 MG/3ML) 0.083% nebulizer solution Take by nebulization. 11/09/21  Yes [provider]  albuterol (VENTOLIN HFA) 108 (90 Base) MCG/ACT inhaler Inhale into the lungs. 11/28/18  Yes [provider]  ALPRAZolam Prudy Feeler) 0.5 MG tablet Take 0.5 mg by mouth 4 (four) times daily as needed. 10/27/21  Yes [provider]  amphetamine-dextroamphetamine (ADDERALL) 20 MG tablet Take 20 mg by mouth 3 (three) times daily. 11/10/21  Yes [provider]  Ernestina Patches 62.5-25 MCG/ACT AEPB Inhale 1 puff into the lungs daily. 11/09/21  Yes [provider]  baclofen (LIORESAL) 10 MG tablet Take 10 mg by mouth 3 (three) times daily. 09/15/21  Yes [provider]  clonazePAM (KLONOPIN) 1 MG tablet Take 1 mg by mouth 3 (three) times daily as needed. 11/16/21  Yes [provider]  DULoxetine (CYMBALTA) 60 MG capsule Take 60 mg by mouth daily. 06/06/21  Yes [provider]  famotidine (PEPCID) 40 MG tablet Take 40 mg by mouth at bedtime. 09/03/20  Yes [provider]  levothyroxine (SYNTHROID) 75 MCG tablet  10/04/20  Yes [provider]  metoprolol tartrate (LOPRESSOR) 50 MG tablet Take 50 mg by mouth See admin instructions.  Take 50 mg in the morning and 25 every evening 09/28/21  Yes [provider]  pantoprazole (PROTONIX) 40 MG tablet Take 40 mg by mouth daily. 09/16/21  Yes [provider]  QUEtiapine (SEROQUEL) 50 MG tablet Take 50 mg by mouth at bedtime. 08/23/21  Yes [provider]  rosuvastatin (CRESTOR) 20 MG tablet Take 20 mg by mouth at bedtime. 09/28/21  Yes [provider]  benzonatate (TESSALON) 200 MG capsule Take by mouth. Patient not taking: Reported on 11/17/2021 07/04/21   [provider]  clarithromycin (BIAXIN) 500 MG tablet Take 500 mg by mouth 2 (two) times daily. Patient not taking: Reported on 11/17/2021 06/28/21   [provider]  nystatin (MYCOSTATIN) 100000 UNIT/ML suspension Take by mouth. Patient not taking: Reported on 11/17/2021 10/04/21   [provider]      Allergies    Patient has no known allergies.    Review of Systems   Review of Systems  Neurological:  Positive for headaches.    Physical Exam Updated Vital Signs BP 114/82   Pulse 78   Temp 98.5 F (36.9 C) (Oral)   Resp 17   Wt 55.3 kg   SpO2 99%   BMI 21.61 kg/m  Physical Exam  ED Results / Procedures / Treatments   Labs (all labs ordered are listed, but only abnormal results are displayed) Labs Reviewed  URINALYSIS, ROUTINE W REFLEX MICROSCOPIC - Abnormal; Notable for the following components:      Result Value   Color,  Urine COLORLESS (*)    Specific Gravity, Urine 1.001 (*)    Hgb urine dipstick SMALL (*)    All other components within normal limits  BASIC METABOLIC PANEL  CBC  POC URINE PREG, ED    EKG None  Radiology CT HEAD WO CONTRAST  Result Date: 11/17/2021 CLINICAL DATA:  Left occipital headache after fall on pavement EXAM: CT HEAD WITHOUT CONTRAST TECHNIQUE: Contiguous axial images were obtained from the base of the skull through the vertex without intravenous contrast. RADIATION DOSE REDUCTION: This exam was performed according to  the departmental dose-optimization program which includes automated exposure control, adjustment of the mA and/or kV according to patient size and/or use of iterative reconstruction technique. COMPARISON:  None Available. FINDINGS: Brain: No intracranial hemorrhage, mass effect, or evidence of acute infarct. No hydrocephalus. No extra-axial fluid collection. Mild cerebral atrophy and chronic microangiopathic ischemic change. Vascular: No hyperdense vessel. Intracranial arterial calcification. Skull: No fracture or focal lesion. Question small contusion about the left posterolateral scalp. Sinuses/Orbits: No acute finding. Moderate mucosal thickening left maxillary sinus. Other: None. IMPRESSION: No acute intracranial abnormality or calvarial fracture. Electronically Signed   By: Minerva Fester M.D.   On: 11/17/2021 19:40    Procedures Procedures  {Document cardiac monitor, telemetry assessment procedure when appropriate:1}  Medications Ordered in ED Medications - No data to display  ED Course/ Medical Decision Making/ A&P                           Medical Decision Making Amount and/or Complexity of Data Reviewed Labs: ordered. Radiology: ordered.   ***  {Document critical care time when appropriate:1} {Document review of labs and clinical decision tools ie heart score, Chads2Vasc2 etc:1}  {Document your independent review of radiology images, and any outside records:1} {Document your discussion with family members, caretakers, and with consultants:1} {Document social determinants of health affecting pt's care:1} {Document your decision making why or why not admission, treatments were needed:1} Final Clinical Impression(s) / ED Diagnoses Final diagnoses:  None    Rx / DC Orders ED Discharge Orders     None

## 2021-11-17 NOTE — ED Triage Notes (Signed)
Pt states she had a syncopal fall vs pavement on Monday night, landing onto L side, hitting head. She continues to have L occipital HA, nausea and blurred vision since. PCP told her she needs a head CT. No thinners reported

## 2022-01-13 IMAGING — DX DG CHEST 1V PORT
1 series · 1 of 1 positions shown · non-contrast
Comparison: None.

CLINICAL DATA: Chest pressure

EXAM:
PORTABLE CHEST 1 VIEW

[chest ap]
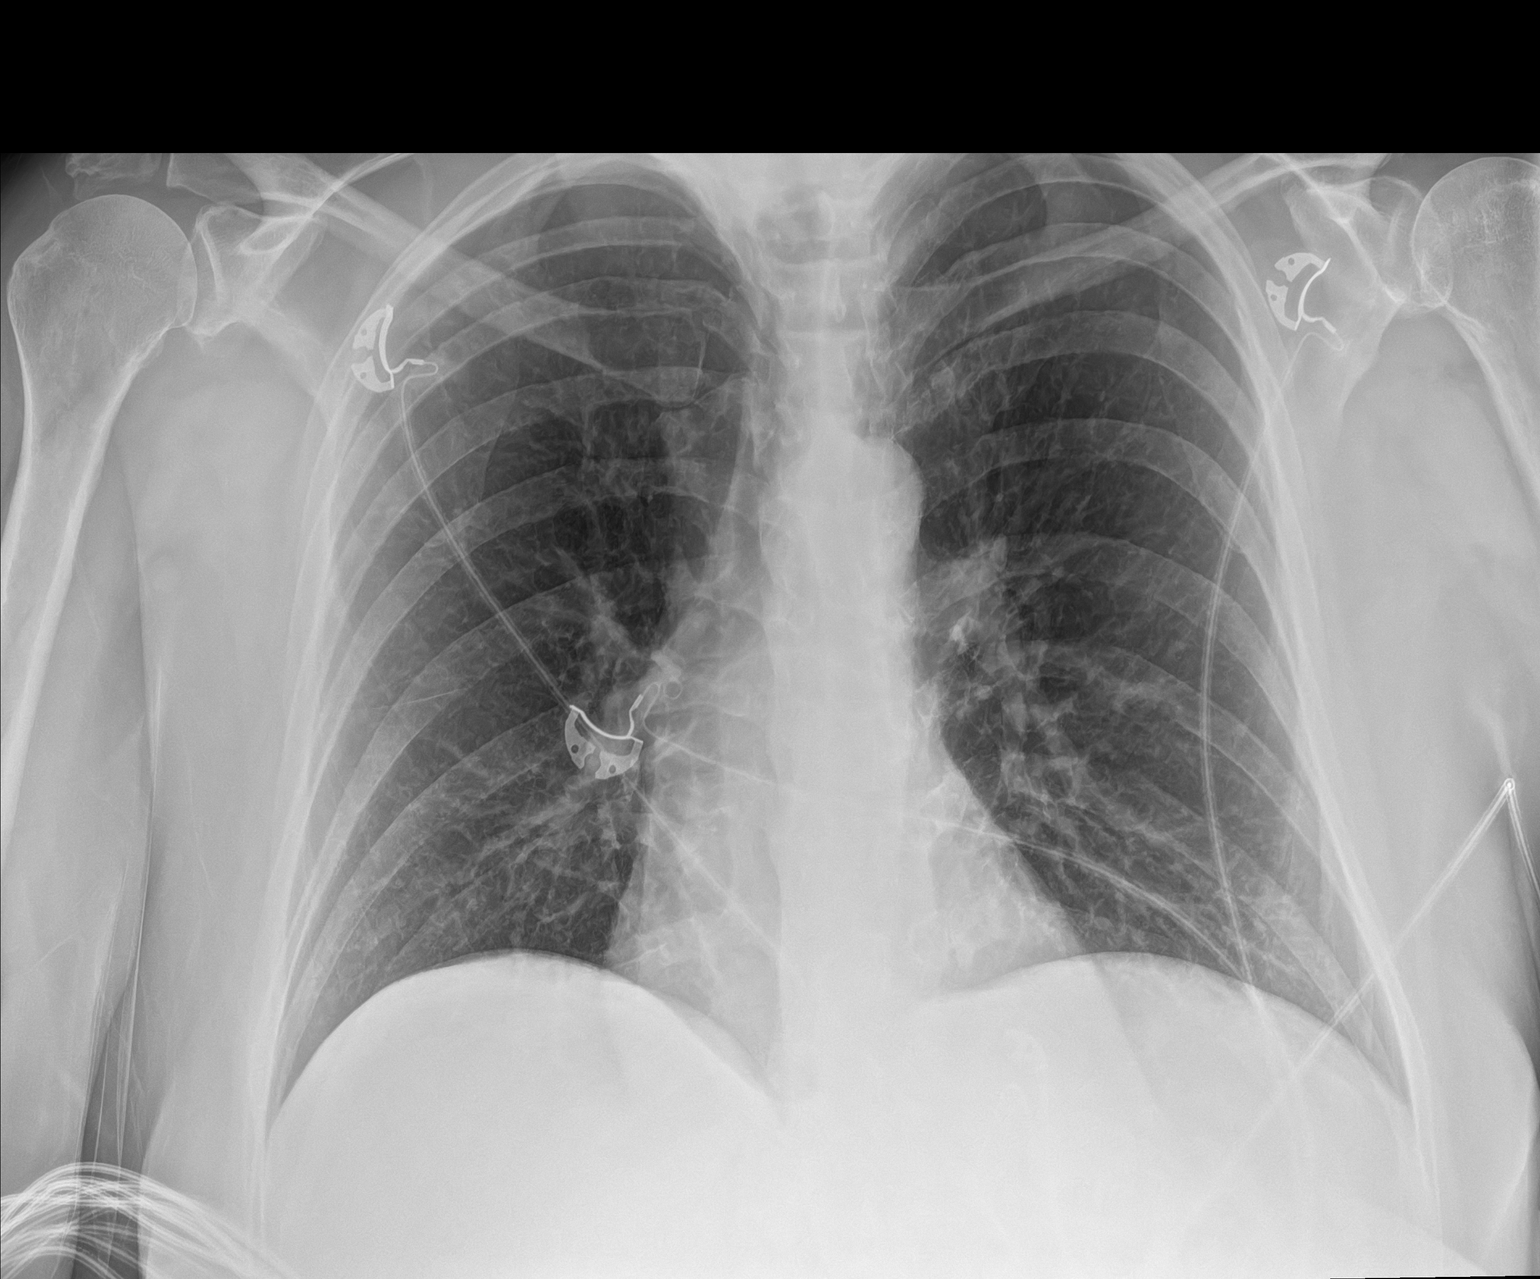

[1 of 1 positions shown; findings below may reference images not displayed]

FINDINGS: Normal heart size and mediastinal contours. No acute infiltrate or
edema. No effusion or pneumothorax. No acute osseous findings.
IMPRESSION: No active disease.

## 2022-04-10 ENCOUNTER — Emergency Department (HOSPITAL_COMMUNITY)
Admission: EM | Admit: 2022-04-10 | Discharge: 2022-04-10 | Disposition: A | Discharge status: Home / Self Care | Payer: Medicare HMO | Attending: Emergency Medicine | Admitting: Emergency Medicine

## 2022-04-10 ENCOUNTER — Emergency Department (HOSPITAL_COMMUNITY): Payer: Medicare HMO

## 2022-04-10 ENCOUNTER — Other Ambulatory Visit: Payer: Self-pay

## 2022-04-10 DIAGNOSIS — Z7951 Long term (current) use of inhaled steroids: Secondary | ICD-10-CM | POA: Insufficient documentation

## 2022-04-10 DIAGNOSIS — I10 Essential (primary) hypertension: Secondary | ICD-10-CM | POA: Diagnosis not present

## 2022-04-10 DIAGNOSIS — M25561 Pain in right knee: Secondary | ICD-10-CM | POA: Insufficient documentation

## 2022-04-10 DIAGNOSIS — M25461 Effusion, right knee: Secondary | ICD-10-CM | POA: Insufficient documentation

## 2022-04-10 DIAGNOSIS — J45909 Unspecified asthma, uncomplicated: Secondary | ICD-10-CM | POA: Insufficient documentation

## 2022-04-10 DIAGNOSIS — E871 Hypo-osmolality and hyponatremia: Secondary | ICD-10-CM | POA: Insufficient documentation

## 2022-04-10 LAB — CBC
HCT: 44 % (ref 36.0–46.0)
Hemoglobin: 14.6 g/dL (ref 12.0–15.0)
MCH: 29.6 pg (ref 26.0–34.0)
MCHC: 33.2 g/dL (ref 30.0–36.0)
MCV: 89.2 fL (ref 80.0–100.0)
Platelets: 353 10*3/uL (ref 150–400)
RBC: 4.93 MIL/uL (ref 3.87–5.11)
RDW: 12 % (ref 11.5–15.5)
WBC: 7.8 10*3/uL (ref 4.0–10.5)
nRBC: 0 % (ref 0.0–0.2)

## 2022-04-10 LAB — BASIC METABOLIC PANEL
Anion gap: 10 (ref 5–15)
BUN: 11 mg/dL (ref 6–20)
CO2: 26 mmol/L (ref 22–32)
Calcium: 9.3 mg/dL (ref 8.9–10.3)
Chloride: 98 mmol/L (ref 98–111)
Creatinine, Ser: 0.63 mg/dL (ref 0.44–1.00)
GFR, Estimated: >60 mL/min (ref >60)
Glucose, Bld: 120 mg/dL — ABNORMAL HIGH (ref 70–99)
Potassium: 4.1 mmol/L (ref 3.5–5.1)
Sodium: 134 mmol/L — ABNORMAL LOW (ref 135–145)

## 2022-04-10 MED ORDER — IOHEXOL 300 MG/ML  SOLN
75.0000 mL | Freq: Once | INTRAMUSCULAR | Status: AC | PRN
  Administered 2022-04-10: 75 mL via INTRAVENOUS

## 2022-04-10 MED ORDER — IBUPROFEN 400 MG PO TABS
600.0000 mg | ORAL_TABLET | Freq: Once | ORAL | Status: AC
  Administered 2022-04-10: 600 mg via ORAL
  Filled 2022-04-10: qty 2

## 2022-04-10 NOTE — ED Triage Notes (Signed)
Pt in with R knee pain, increased swelling for a few days. 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 Arrives with knee brace, states her knee is 4x's size of her norm. Reports 55lb weight loss over the past year.

## 2022-04-10 NOTE — ED Notes (Signed)
Patient transported to CT 

## 2022-04-10 NOTE — ED Provider Notes (Signed)
[facility] Provider Note   CSN: [id number] Arrival date & time: 04/10/22  1129     History  Chief Complaint  Patient presents with   Knee Pain    Meika Earll is a 57 y.o. female.   Knee Pain   57 year old female presents emergency department with complaints of right-sided knee pain.  Patient states that she has had a history of right-sided knee pain.  The past 5 or so years.  Had repair of meniscus 5 years ago with intermittent corticosteroid injections since then.  States that she noted acute exacerbation of pain with subsequent swelling Wednesday of last week.  Was seen by orthopedics outpatient with x-ray concerning for mass on distal right femur.  States that she went to imaging center and order for imaging was not pain so presents emergency department for expedited imaging.  Reports pain in right knee worsened with weightbearing and with movement.  Reports feelings of knee giving out on her with certain movements secondary to pain.  Denies any weakness or sensory deficits in affected leg, fever, known injury/trauma.  Past medical history significant for hypertension, thyroid disease, OSA, chronic pain, fibromyalgia, hypercholesterolemia, asthma  Home Medications Prior to Admission medications   Medication Sig Start Date End Date Taking? Authorizing Provider  albuterol (PROVENTIL) (2.5 MG/3ML) 0.083% nebulizer solution Take by nebulization. 11/09/21   [provider]  albuterol (VENTOLIN HFA) 108 (90 Base) MCG/ACT inhaler Inhale into the lungs. 11/28/18   [provider]  ALPRAZolam Duanne Moron) 0.5 MG tablet Take 0.5 mg by mouth 4 (four) times daily as needed. 10/27/21   [provider]  amphetamine-dextroamphetamine (ADDERALL) 20 MG tablet Take 20 mg by mouth 3 (three) times daily. 11/10/21   [provider]  Celedonio Miyamoto 62.5-25 MCG/ACT AEPB Inhale 1 puff into the lungs daily. 11/09/21   [provider]  baclofen (LIORESAL) 10 MG tablet Take 10 mg by mouth 3 (three) times daily. 09/15/21   [provider]  benzonatate (TESSALON) 200 MG capsule Take by mouth. Patient not taking: Reported on 11/17/2021 07/04/21   [provider]  clarithromycin (BIAXIN) 500 MG tablet Take 500 mg by mouth 2 (two) times daily. Patient not taking: Reported on 11/17/2021 06/28/21   [provider]  clonazePAM (KLONOPIN) 1 MG tablet Take 1 mg by mouth 3 (three) times daily as needed. 11/16/21   [provider]  DULoxetine (CYMBALTA) 60 MG capsule Take 60 mg by mouth daily. 06/06/21   [provider]  famotidine (PEPCID) 40 MG tablet Take 40 mg by mouth at bedtime. 09/03/20   [provider]  levothyroxine (SYNTHROID) 75 MCG tablet  10/04/20   [provider]  metoprolol tartrate (LOPRESSOR) 50 MG tablet Take 50 mg by mouth See admin instructions. Take 50 mg in the morning and 25 every evening 09/28/21   [provider]  nystatin (MYCOSTATIN) 100000 UNIT/ML suspension Take by mouth. Patient not taking: Reported on 11/17/2021 10/04/21   [provider]  pantoprazole (PROTONIX) 40 MG tablet Take 40 mg by mouth daily. 09/16/21   [provider]  QUEtiapine (SEROQUEL) 50 MG tablet Take 50 mg by mouth at bedtime. 08/23/21   [provider]  rosuvastatin (CRESTOR) 20 MG tablet Take 20 mg by mouth at bedtime. 09/28/21   [provider]      Allergies    Patient has no known allergies.    Review of Systems   Review of Systems  All other systems reviewed and are negative.   Physical Exam Updated Vital Signs BP (!) 149/98 (BP Location: Right Arm)   Pulse 71   Temp 98.3 F (36.8 C) (Oral)   Resp 18   Wt 51.7 kg   SpO2 100%   BMI 20.19 kg/m  Physical Exam Vitals and nursing note reviewed.  Constitutional:      General: She is not in acute distress.    Appearance: She is well-developed.  HENT:     Head: Normocephalic and  atraumatic.  Eyes:     Conjunctiva/sclera: Conjunctivae normal.  Cardiovascular:     Rate and Rhythm: Normal rate and regular rhythm.     Heart sounds: No murmur heard. Pulmonary:     Effort: Pulmonary effort is normal. No respiratory distress.     Breath sounds: Normal breath sounds.  Abdominal:     Palpations: Abdomen is soft.     Tenderness: There is no abdominal tenderness.  Musculoskeletal:        General: No swelling.     Cervical back: Neck supple.     Comments: Patient with full range of motion of right knee in flexion extension with pain exacerbated with greater degrees of flexion.  Swelling noted distal tibia or superior knee with no obvious erythematous, palpable fluctuant/indurated skin.  2+ pedal pulses bilaterally.  Patient has symmetric strength in knee flexion extension bilaterally 5 out of 5.  No sensory deficits distally.  No obvious breaks in skin appreciated.  Skin:    General: Skin is warm and dry.     Capillary Refill: Capillary refill takes less than 2 seconds.  Neurological:     Mental Status: She is alert.  Psychiatric:        Mood and Affect: Mood normal.     ED Results / Procedures / Treatments   Labs (all labs ordered are listed, but only abnormal results are displayed) Labs Reviewed  BASIC METABOLIC PANEL - Abnormal; Notable for the following components:      Result Value   Sodium 134 (*)    Glucose, Bld 120 (*)    All other components within normal limits  CBC    EKG None  Radiology CT KNEE RIGHT W CONTRAST  Result Date: 04/10/2022 CLINICAL DATA:  Right knee pain and swelling. EXAM: CT OF THE RIGHT KNEE WITH CONTRAST TECHNIQUE: Multidetector CT imaging was performed following the standard protocol during bolus administration of intravenous contrast. RADIATION DOSE REDUCTION: This exam was performed according to the departmental dose-optimization program which includes automated exposure control, adjustment of the mA and/or kV according to  patient size and/or use of iterative reconstruction technique. CONTRAST:  76mL OMNIPAQUE IOHEXOL 300 MG/ML  SOLN COMPARISON:  None Available. FINDINGS: Bones/Joint/Cartilage No focal bone lesion. No fracture or dislocation. Mild medial and lateral compartment joint space narrowing with small marginal osteophytes. Large joint effusion with thin synovial enhancement. 1.8 x 1.4 x 1.0 cm (AP by transverse by CC) round noncalcified high density lesion in the superior joint space (series 7, image 15; series 9, image 84). Ligaments Ligaments are suboptimally evaluated by CT. Muscles and Tendons Grossly intact.  No muscle atrophy. Soft tissue No fluid collection or hematoma.  No soft tissue mass. IMPRESSION: 1.  No acute osseous abnormality.  No focal bone lesion. 2. Mild medial and lateral compartment osteoarthritis. 3. Large joint effusion with 1.8 cm round noncalcified high density lesion in the superior joint space. This could represent an intra-articular body, focal synovitis, or mass.  Follow-up outpatient MRI of the knee with and without contrast is recommended. Arthrocentesis should be considered. Electronically Signed   By: Titus Dubin M.D.   On: 04/10/2022 14:16    Procedures Procedures    Medications Ordered in ED Medications  ibuprofen (ADVIL) tablet 600 mg (has no administration in time range)  iohexol (OMNIPAQUE) 300 MG/ML solution 75 mL (75 mLs Intravenous Contrast Given 04/10/22 1348)    ED Course/ Medical Decision Making/ A&P                             Medical Decision Making Amount and/or Complexity of Data Reviewed Labs: ordered. Radiology: ordered.  Risk Prescription drug management.   This patient presents to the ED for concern of knee pain, this involves an extensive number of treatment options, and is a complaint that carries with it a high risk of complications and morbidity.  The differential diagnosis includes fracture, strain/sprain, dislocation, septic arthritis,  malignancy, foreign body retention, gout, Reiter's syndrome, cellulitis, erysipelas, necrotizing fasciitis, compartment syndrome   Co morbidities that complicate the patient evaluation  See HPI   Additional history obtained:  Additional history obtained from EMR External records from outside source obtained and reviewed including hospital records   Lab Tests:  I Ordered, and personally interpreted labs.  The pertinent results include: Mild hyponatremia with a sodium 134.  Otherwise, electrolytes within normal range.  No renal dysfunction.  No leukocytosis.  No evidence of anemia.  Platelets within range.   Imaging Studies ordered:  I ordered imaging studies including CT right knee I independently visualized and interpreted imaging which showed no acute osseous abnormality.  Mild medial and lateral compartment osteoarthritis.  Large joint effusion with 1.8 cm round noncalcified high density lesion in superior joint space. I agree with the radiologist interpretation  Cardiac Monitoring: / EKG:  The patient was maintained on a cardiac monitor.  I personally viewed and interpreted the cardiac monitored which showed an underlying rhythm of: Sinus rhythm   Consultations Obtained:  N/a   Problem List / ED Course / Critical interventions / Medication management  Right knee pain I ordered medication including Motrin   Reevaluation of the patient after these medicines showed that the patient improved I have reviewed the patients home medicines and have made adjustments as needed   Social Determinants of Health:  Chronic cigarette use.  Denies illicit drug use.   Test / Admission - Considered:  Right knee pain Vitals signs significant for hypertension with a blood pressure 149/98.  Recommend follow-up with primary care regarding elevation blood pressure.. Otherwise within normal range and stable throughout visit. Laboratory/imaging studies significant for: See  above Patient's right knee pain most likely secondary to lesion as indicated above.  No indication for infectious etiology or gout.  Symptoms followed by orthopedics closely outpatient.  Tapping of joint consider the patient declined for outpatient management by orthopedics.  In the meantime, recommend rest, ice, elevation as well as NSAIDs for right knee swelling/pain.  Patient already has brace at home for added support.  Treatment plan discussed at length with patient and she acknowledged understanding was agreeable to said plan. Worrisome signs and symptoms were discussed with the patient, and the patient acknowledged understanding to return to the ED if noticed. Patient was stable upon discharge.          Final Clinical Impression(s) / ED Diagnoses Final diagnoses:  Right knee pain, unspecified chronicity  Rx / DC Orders ED Discharge Orders     None         Peter Garter, [place] 04/10/22 1427    Bethann Berkshire, MD 04/11/22 1134

## 2022-04-10 NOTE — Discharge Instructions (Addendum)
Note that your workup today was overall consistent with possible 1.8 cm round lesion in the upper part of your joint on your right knee.  As discussed, close follow-up with orthopedics recommended for reevaluation given the imaging studies have been performed today.  In the meantime, take Motrin as needed for pain.  Recommend rest, ice, elevation of affected extremity.  Please do not hesitate to return to emergency department if the worrisome signs and symptoms we discussed become apparent.

## 2022-07-31 IMAGING — DX DG CHEST 1V PORT
1 series · 1 of 1 positions shown · non-contrast
Comparison: Chest radiograph dated 01/12/2020 and CT dated
01/12/2020

CLINICAL DATA: 54-year-old female with chest pain.

EXAM:
PORTABLE CHEST 1 VIEW

[chest ap]
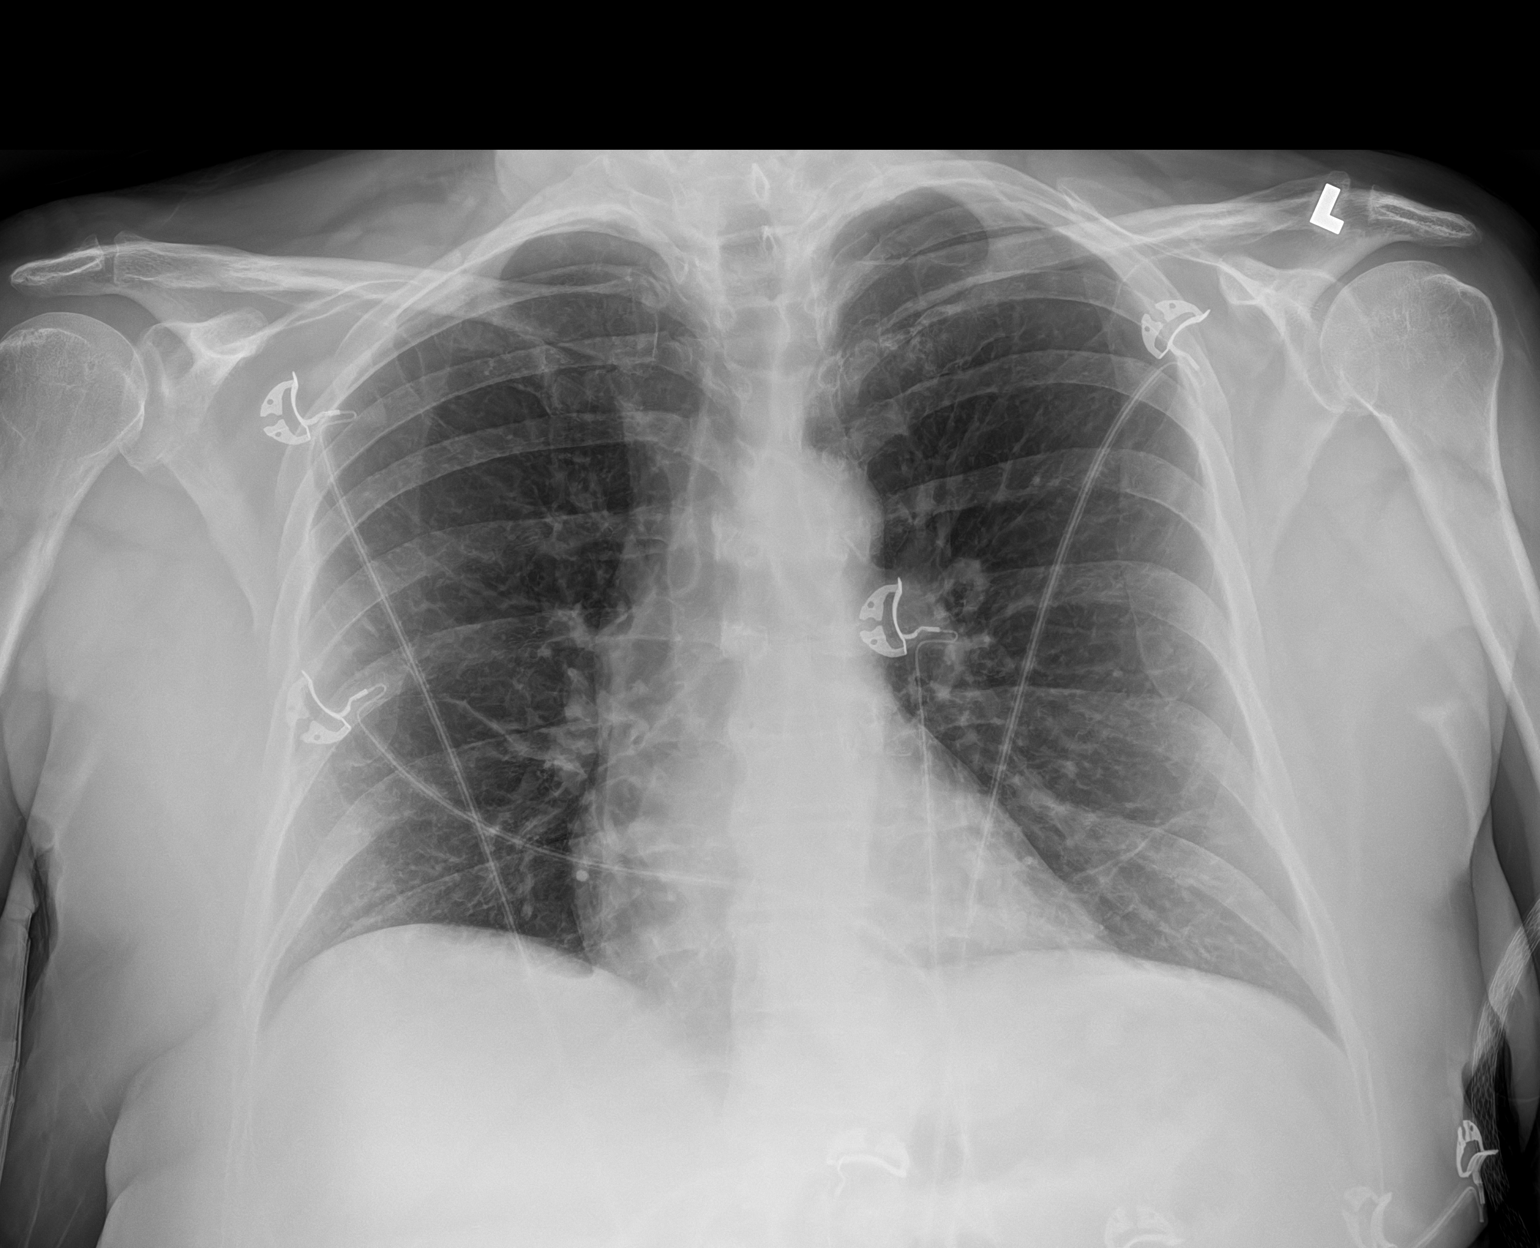

[1 of 1 positions shown; findings below may reference images not displayed]

FINDINGS: No focal consolidation, pleural effusion, or pneumothorax. The
cardiac silhouette is within limits. No acute osseous pathology.
IMPRESSION: No active disease.

## 2022-10-23 ENCOUNTER — Emergency Department (HOSPITAL_COMMUNITY): Payer: Medicare HMO

## 2022-10-23 ENCOUNTER — Other Ambulatory Visit: Payer: Self-pay

## 2022-10-23 ENCOUNTER — Encounter (HOSPITAL_COMMUNITY): Payer: Self-pay | Admitting: Emergency Medicine

## 2022-10-23 ENCOUNTER — Emergency Department (HOSPITAL_COMMUNITY)
Admission: EM | Admit: 2022-10-23 | Discharge: 2022-10-23 | Disposition: A | Discharge status: Home / Self Care | Payer: Medicare HMO | Attending: Emergency Medicine | Admitting: Emergency Medicine

## 2022-10-23 DIAGNOSIS — R519 Headache, unspecified: Secondary | ICD-10-CM | POA: Insufficient documentation

## 2022-10-23 DIAGNOSIS — Z1152 Encounter for screening for COVID-19: Secondary | ICD-10-CM | POA: Insufficient documentation

## 2022-10-23 LAB — RESP PANEL BY RT-PCR (RSV, FLU A&B, COVID)  RVPGX2
Influenza A by PCR: NEGATIVE
Influenza B by PCR: NEGATIVE
Resp Syncytial Virus by PCR: NEGATIVE
SARS Coronavirus 2 by RT PCR: NEGATIVE

## 2022-10-23 MED ORDER — PROCHLORPERAZINE EDISYLATE 10 MG/2ML IJ SOLN
10.0000 mg | Freq: Once | INTRAMUSCULAR | Status: AC
  Administered 2022-10-23: 10 mg via INTRAVENOUS
  Filled 2022-10-23: qty 2

## 2022-10-23 MED ORDER — KETOROLAC TROMETHAMINE 15 MG/ML IJ SOLN
15.0000 mg | Freq: Once | INTRAMUSCULAR | Status: AC
  Administered 2022-10-23: 15 mg via INTRAVENOUS
  Filled 2022-10-23: qty 1

## 2022-10-23 MED ORDER — ACETAMINOPHEN 500 MG PO TABS
1000.0000 mg | ORAL_TABLET | Freq: Once | ORAL | Status: AC
  Administered 2022-10-23: 1000 mg via ORAL
  Filled 2022-10-23: qty 2

## 2022-10-23 MED ORDER — LACTATED RINGERS IV BOLUS
500.0000 mL | Freq: Once | INTRAVENOUS | Status: AC
  Administered 2022-10-23: 500 mL via INTRAVENOUS

## 2022-10-23 NOTE — ED Provider Notes (Signed)
[place] EMERGENCY DEPARTMENT AT [facility] Provider Note   CSN: [id number] Arrival date & time: 10/23/22  1600     History {Add pertinent medical, surgical, social history, OB history to HPI:1} Chief Complaint  Patient presents with   Headache    Deborah Delgado is a 57 y.o. female.   Headache      Home Medications Prior to Admission medications   Medication Sig Start Date End Date Taking? Authorizing Provider  albuterol (PROVENTIL) (2.5 MG/3ML) 0.083% nebulizer solution Take by nebulization. 11/09/21   [provider]  albuterol (VENTOLIN HFA) 108 (90 Base) MCG/ACT inhaler Inhale into the lungs. 11/28/18   [provider]  ALPRAZolam Prudy Feeler) 0.5 MG tablet Take 0.5 mg by mouth 4 (four) times daily as needed. 10/27/21   [provider]  amphetamine-dextroamphetamine (ADDERALL) 20 MG tablet Take 20 mg by mouth 3 (three) times daily. 11/10/21   [provider]  Ernestina Patches 62.5-25 MCG/ACT AEPB Inhale 1 puff into the lungs daily. 11/09/21   [provider]  baclofen (LIORESAL) 10 MG tablet Take 10 mg by mouth 3 (three) times daily. 09/15/21   [provider]  benzonatate (TESSALON) 200 MG capsule Take by mouth. Patient not taking: Reported on 11/17/2021 07/04/21   [provider]  clarithromycin (BIAXIN) 500 MG tablet Take 500 mg by mouth 2 (two) times daily. Patient not taking: Reported on 11/17/2021 06/28/21   [provider]  clonazePAM (KLONOPIN) 1 MG tablet Take 1 mg by mouth 3 (three) times daily as needed. 11/16/21   [provider]  DULoxetine (CYMBALTA) 60 MG capsule Take 60 mg by mouth daily. 06/06/21   [provider]  famotidine (PEPCID) 40 MG tablet Take 40 mg by mouth at bedtime. 09/03/20   [provider]  levothyroxine (SYNTHROID) 75 MCG tablet  10/04/20   [provider]  metoprolol tartrate (LOPRESSOR) 50 MG tablet Take 50 mg by mouth See admin instructions. Take  50 mg in the morning and 25 every evening 09/28/21   [provider]  nystatin (MYCOSTATIN) 100000 UNIT/ML suspension Take by mouth. Patient not taking: Reported on 11/17/2021 10/04/21   [provider]  pantoprazole (PROTONIX) 40 MG tablet Take 40 mg by mouth daily. 09/16/21   [provider]  QUEtiapine (SEROQUEL) 50 MG tablet Take 50 mg by mouth at bedtime. 08/23/21   [provider]  rosuvastatin (CRESTOR) 20 MG tablet Take 20 mg by mouth at bedtime. 09/28/21   [provider]      Allergies    Patient has no known allergies.    Review of Systems   Review of Systems  Neurological:  Positive for headaches.    Physical Exam Updated Vital Signs BP 100/84 (BP Location: Right Arm)   Pulse 79   Temp 98.4 F (36.9 C) (Oral)   Resp 16   Ht 5\' 3"  (1.6 m)   Wt 54 kg   SpO2 96%   BMI 21.08 kg/m  Physical Exam  ED Results / Procedures / Treatments   Labs (all labs ordered are listed, but only abnormal results are displayed) Labs Reviewed  RESP PANEL BY RT-PCR (RSV, FLU A&B, COVID)  [id number]    EKG None  Radiology CT Head Wo Contrast  Result Date: 10/23/2022 CLINICAL DATA:  Headache with increasing frequency or severity. Poor vision and nausea. No relief from Topamax. EXAM: CT HEAD WITHOUT CONTRAST TECHNIQUE: Contiguous axial images were obtained from the base of the skull through the  vertex without intravenous contrast. RADIATION DOSE REDUCTION: This exam was performed according to the departmental dose-optimization program which includes automated exposure control, adjustment of the mA and/or kV according to patient size and/or use of iterative reconstruction technique. COMPARISON:  CT head 11/17/2021 FINDINGS: Brain: No intracranial hemorrhage, mass effect, or evidence of acute infarct. No hydrocephalus. No extra-axial fluid collection. Vascular: No hyperdense vessel or unexpected calcification. Skull: No fracture or focal lesion.  Sinuses/Orbits: No acute finding. Other: None. IMPRESSION: No acute intracranial abnormality. Electronically Signed   By: Minerva Fester M.D.   On: 10/23/2022 22:38    Procedures Procedures  {Document cardiac monitor, telemetry assessment procedure when appropriate:1}  Medications Ordered in ED Medications  ketorolac (TORADOL) 15 MG/ML injection 15 mg (15 mg Intravenous Given 10/23/22 2135)  prochlorperazine (COMPAZINE) injection 10 mg (10 mg Intravenous Given 10/23/22 2200)  lactated ringers bolus 500 mL (0 mLs Intravenous Stopped 10/23/22 2200)  acetaminophen (TYLENOL) tablet 1,000 mg (1,000 mg Oral Given 10/23/22 2130)    ED Course/ Medical Decision Making/ A&P   {   Click here for ABCD2, HEART and other calculatorsREFRESH Note before signing :1}                              Medical Decision Making Amount and/or Complexity of Data Reviewed Radiology: ordered.  Risk OTC drugs. Prescription drug management.   ***  {Document critical care time when appropriate:1} {Document review of labs and clinical decision tools ie heart score, Chads2Vasc2 etc:1}  {Document your independent review of radiology images, and any outside records:1} {Document your discussion with family members, caretakers, and with consultants:1} {Document social determinants of health affecting pt's care:1} {Document your decision making why or why not admission, treatments were needed:1} Final Clinical Impression(s) / ED Diagnoses Final diagnoses:  None    Rx / DC Orders ED Discharge Orders     None

## 2022-10-23 NOTE — Discharge Instructions (Addendum)
You were seen in the ER for evaluation of your headaches.  Your CT imaging was unremarkable.  You tested negative for COVID, flu, RSV.  I am glad that your headache is feeling better.  I would like for you to follow-up with your neurologist for your headaches. You can take Tylenol and/or ibuprofen as needed for pain.  If you have worsening headache, neck stiffness, vision problems, fever, trouble walking, trouble talking, please return to the nearest emergency department for re-evaluation.  If you have any concerns of any worsening symptoms, please return to the nearest emergency room for evaluation.  Contact a doctor if: Medicine does not help your symptoms. You have a headache that feels different than the other headaches. You feel like you may vomit (nauseous) or you vomit. You have a fever. Get help right away if: Your headache: Gets very bad quickly. Gets worse after a lot of physical activity. You have any of these symptoms: You continue to vomit. A stiff neck. Trouble seeing. Your eye or ear hurts. Trouble speaking. Weak muscles or you lose muscle control. You lose your balance or have trouble walking. You feel like you will pass out (faint) or you pass out. You are mixed up (confused). You have a seizure. These symptoms may be an emergency. Get help right away. Call your local emergency services (911 in the [place] Do not wait to see if the symptoms will go away. Do not drive yourself to the hospital.

## 2022-10-23 NOTE — ED Triage Notes (Signed)
Pt c/o of headaches, poor vision & nausea x1 month. Neurologist placed pt on topamax  for migraines but pt has had no relief.

## 2023-08-09 IMAGING — CT CT RENAL STONE PROTOCOL
2 of 4 series · 16 of 46 positions shown, 18 images · non-contrast
Comparison: None Available.

CLINICAL DATA: Right-sided flank pain



[Series 2: axial st · axial · 0.79mm/px · z∈[+889,+1239]mm · 13 of 81 slices shown, 15 images]
[im 6/81  soft-tissue]
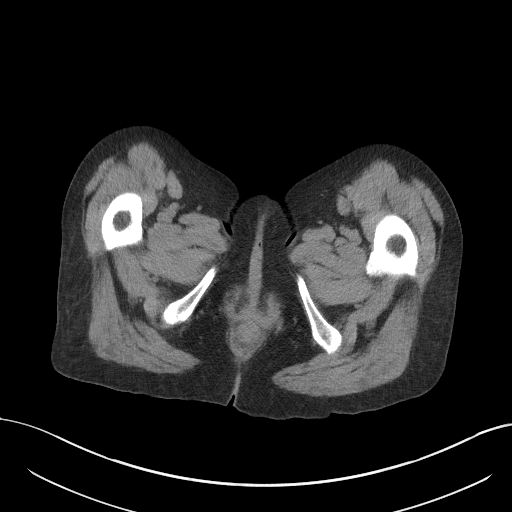
[im 6/81  bone]
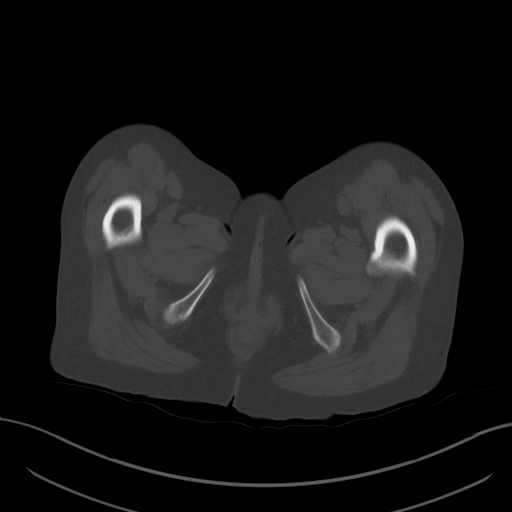
[im 11/81  soft-tissue]
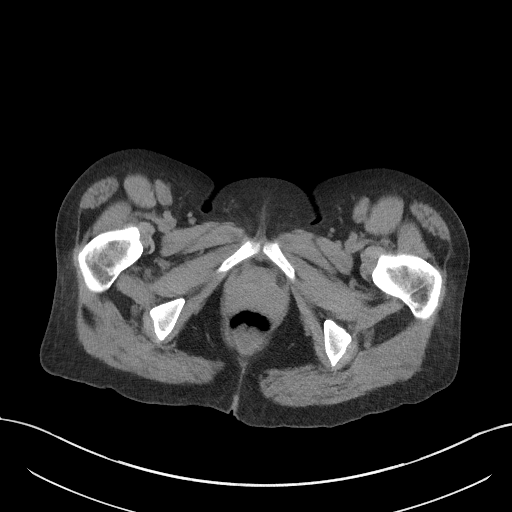
[im 16/81  soft-tissue]
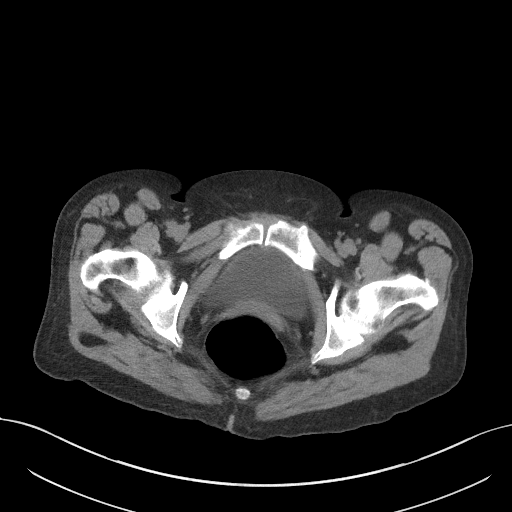
[im 26/81  soft-tissue]
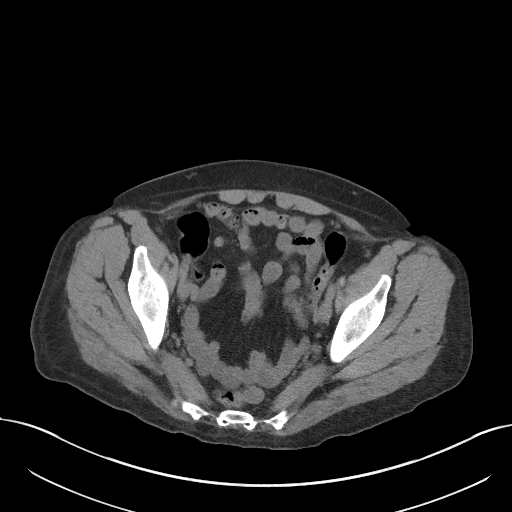
[im 31/81  soft-tissue]
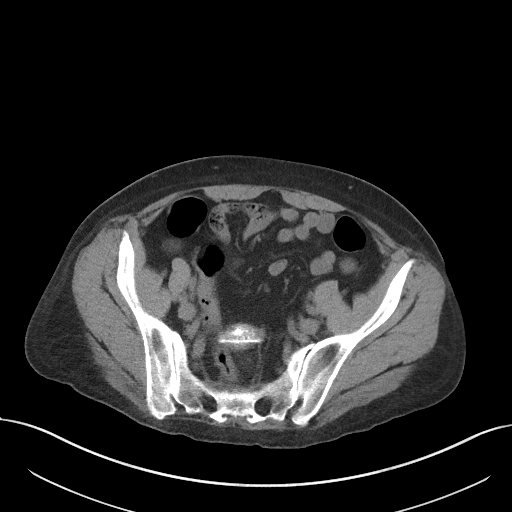
[im 36/81  soft-tissue]
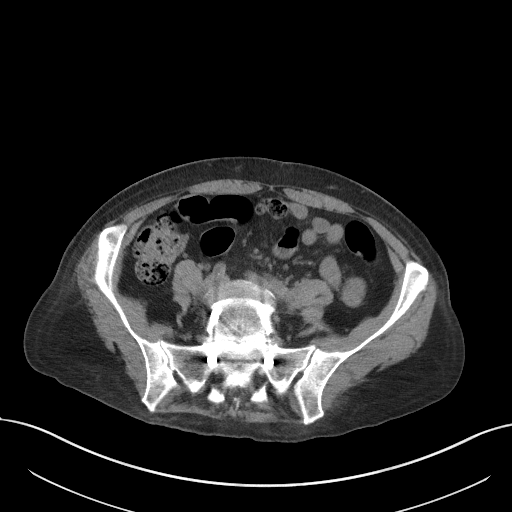
[im 41/81  soft-tissue]
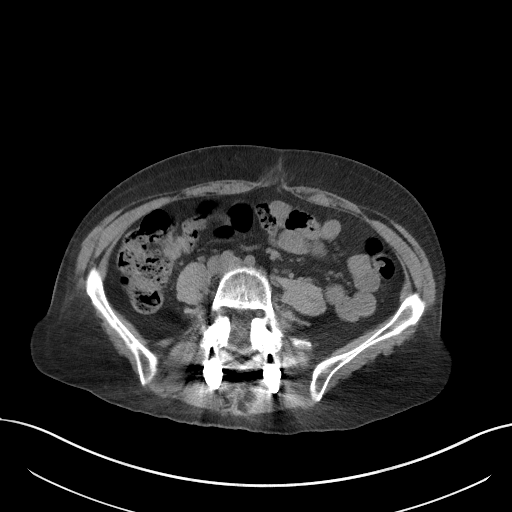
[im 46/81  soft-tissue]
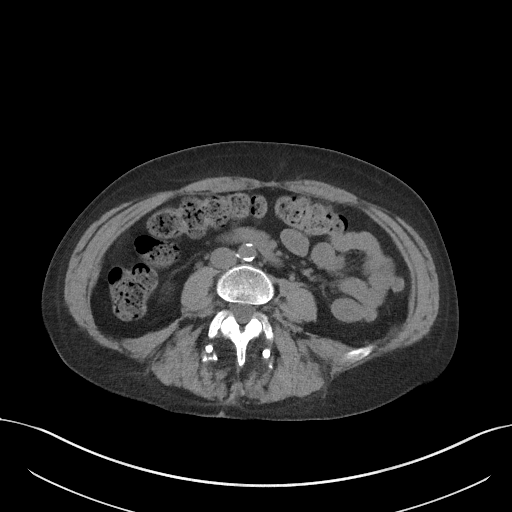
[im 51/81  soft-tissue]
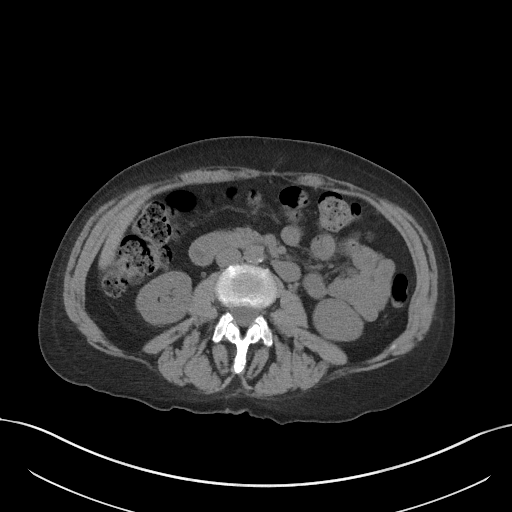
[im 51/81  bone]
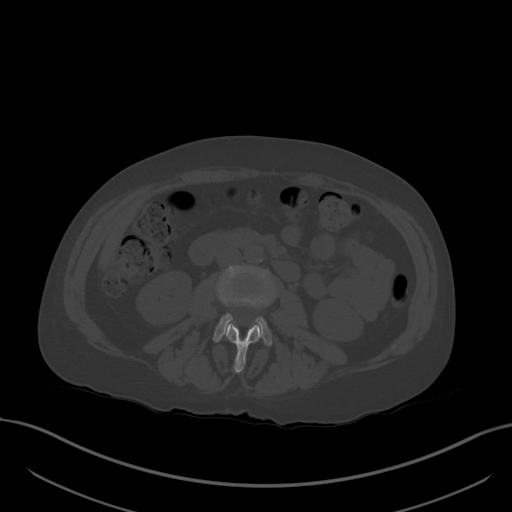
[im 56/81  soft-tissue]
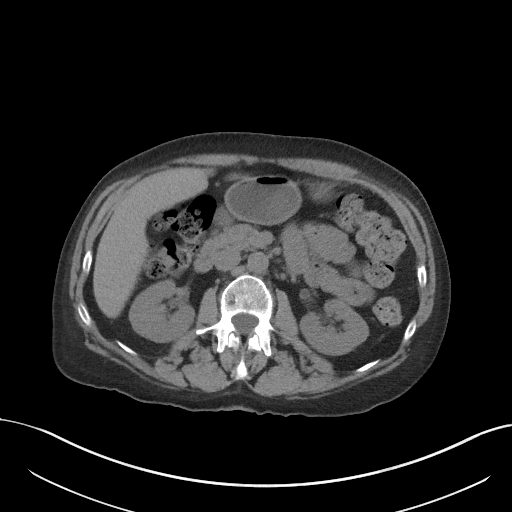
[im 66/81  soft-tissue]
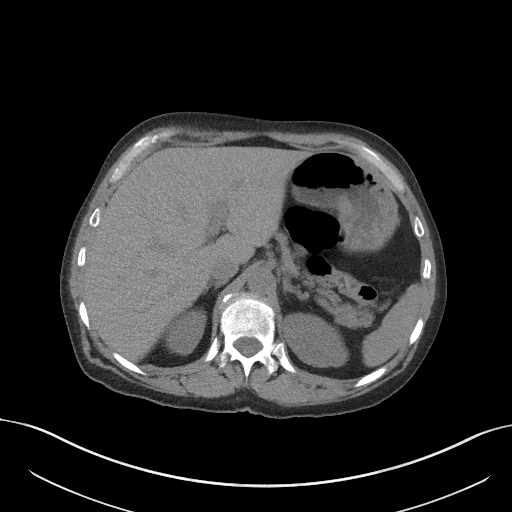
[im 71/81  soft-tissue]
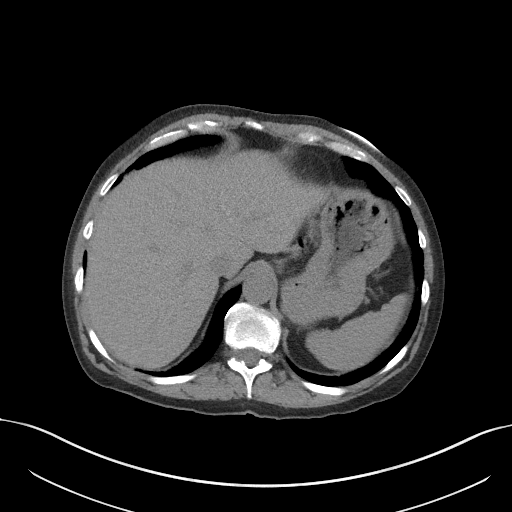
[im 76/81  soft-tissue]
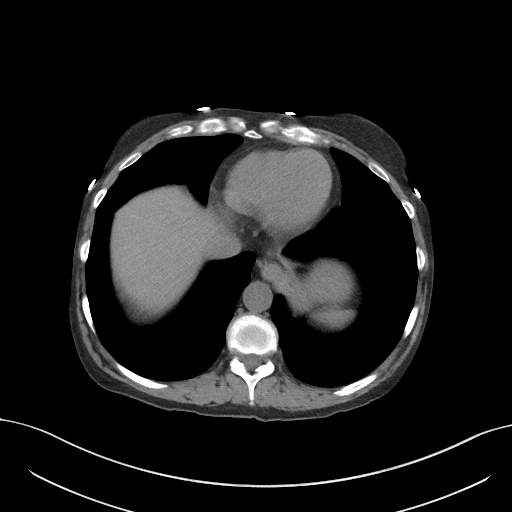

[Series 5: coronal st · coronal · 0.70mm/px · 3 of 95 slices shown]
[im 32/95  soft-tissue]
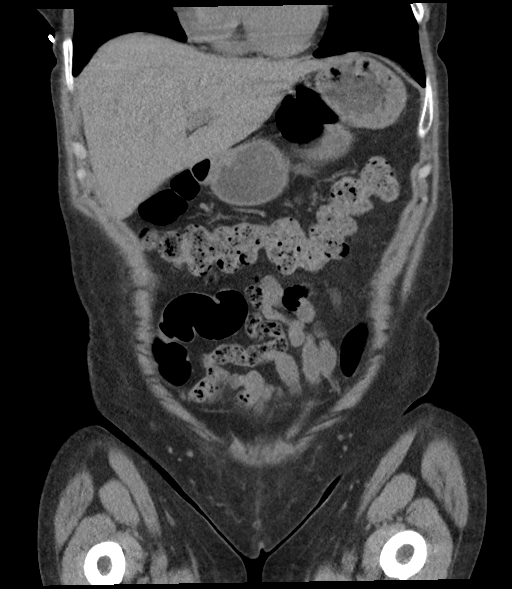
[im 42/95  soft-tissue]
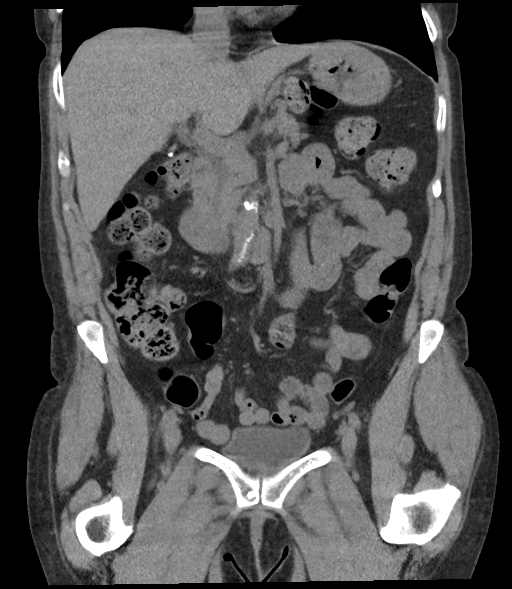
[im 53/95  soft-tissue]
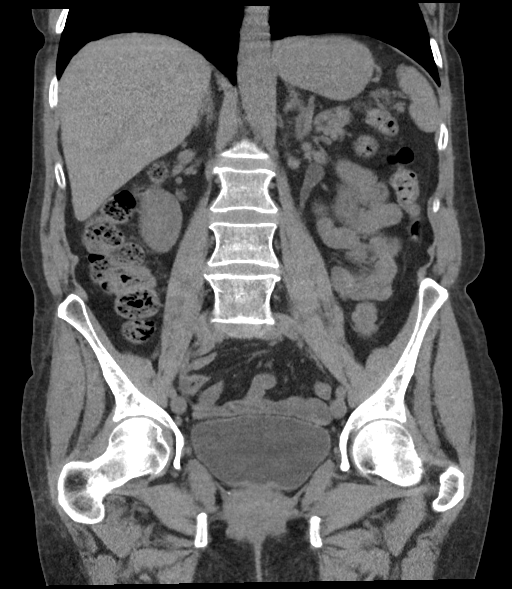

[16 of 46 positions shown; findings below may reference images not displayed]

FINDINGS: Lower chest: Lung bases demonstrate no acute consolidation or
effusion.

Hepatobiliary: No focal liver abnormality is seen. Status post
cholecystectomy. No biliary dilatation.

Pancreas: Unremarkable. No pancreatic ductal dilatation or
surrounding inflammatory changes.

Spleen: Normal in size without focal abnormality.

Adrenals/Urinary Tract: Adrenal glands are within normal limits. No
hydronephrosis. No ureteral stone. The bladder is unremarkable. A
punctate right pelvic calcification is separate from the ureter.

Stomach/Bowel: The stomach is nonenlarged. There is no dilated small
bowel. No acute bowel wall thickening. Negative appendix.

Vascular/Lymphatic: Aortic atherosclerosis. No enlarged abdominal or
pelvic lymph nodes.

Reproductive: Status post hysterectomy. No adnexal masses.

Other: Negative for pelvic effusion or free air.

Musculoskeletal: Posterior fusion hardware at L5-S1 with
degenerative changes at L5-S1. 11 mm anterolisthesis L5 on S1.
Minimal chronic appearing superior endplate deformity at T12.
IMPRESSION: 1. Negative for hydronephrosis or ureteral stone.
2. No CT evidence for acute intra-abdominal or pelvic abnormality.
